# Patient Record
Sex: Female | Born: 1950
Health system: Southern US, Community
[De-identification: ages and names within clinical notes are randomized; demographics above are authoritative.]

## PROBLEM LIST (undated history)

## (undated) DIAGNOSIS — K219 Gastro-esophageal reflux disease without esophagitis: Secondary | ICD-10-CM

## (undated) DIAGNOSIS — I10 Essential (primary) hypertension: Secondary | ICD-10-CM

## (undated) HISTORY — DX: Essential (primary) hypertension: I10

## (undated) HISTORY — DX: Gastro-esophageal reflux disease without esophagitis: K21.9

---

## 2005-07-29 LAB — HM DEXA SCAN

## 2006-07-25 HISTORY — PX: COLONOSCOPY: SHX174

## 2007-07-26 HISTORY — PX: CAROTID ENDARTERECTOMY: SUR193

## 2008-07-26 LAB — HM COLONOSCOPY: HM Colonoscopy: NORMAL

## 2010-09-11 ENCOUNTER — Ambulatory Visit: Payer: Self-pay | Admitting: Internal Medicine

## 2012-11-27 DIAGNOSIS — I6529 Occlusion and stenosis of unspecified carotid artery: Secondary | ICD-10-CM | POA: Insufficient documentation

## 2013-03-26 DIAGNOSIS — R87619 Unspecified abnormal cytological findings in specimens from cervix uteri: Secondary | ICD-10-CM | POA: Insufficient documentation

## 2013-04-24 LAB — HM PAP SMEAR

## 2014-03-03 DIAGNOSIS — R928 Other abnormal and inconclusive findings on diagnostic imaging of breast: Secondary | ICD-10-CM | POA: Insufficient documentation

## 2014-03-12 DIAGNOSIS — Z853 Personal history of malignant neoplasm of breast: Secondary | ICD-10-CM | POA: Insufficient documentation

## 2014-05-25 HISTORY — PX: BILATERAL TOTAL MASTECTOMY WITH AXILLARY LYMPH NODE DISSECTION: SHX6364

## 2014-09-15 ENCOUNTER — Ambulatory Visit: Payer: Self-pay | Admitting: Physician Assistant

## 2014-11-11 LAB — TSH: TSH: 1.47 u[IU]/mL (ref <=5.90)

## 2014-11-11 LAB — LIPID PANEL
CHOLESTEROL: 232 mg/dL — AB (ref 0–200)
HDL: 124 mg/dL — AB (ref 35–70)
LDL CALC: 100 mg/dL
Triglycerides: 38 mg/dL — AB (ref 40–160)

## 2014-11-11 LAB — BASIC METABOLIC PANEL
BUN: 21 mg/dL (ref 4–21)
Creatinine: 0.7 mg/dL (ref <=1.1)

## 2014-11-11 LAB — CBC AND DIFFERENTIAL: HEMOGLOBIN: 13.4 g/dL (ref 12.0–16.0)

## 2014-11-28 DIAGNOSIS — C50119 Malignant neoplasm of central portion of unspecified female breast: Secondary | ICD-10-CM | POA: Insufficient documentation

## 2014-12-25 ENCOUNTER — Other Ambulatory Visit: Payer: Self-pay | Admitting: Internal Medicine

## 2015-05-27 ENCOUNTER — Other Ambulatory Visit: Payer: Self-pay | Admitting: Internal Medicine

## 2015-05-27 ENCOUNTER — Encounter: Payer: Self-pay | Admitting: Internal Medicine

## 2015-05-27 DIAGNOSIS — K2100 Gastro-esophageal reflux disease with esophagitis, without bleeding: Secondary | ICD-10-CM | POA: Insufficient documentation

## 2015-05-27 DIAGNOSIS — M159 Polyosteoarthritis, unspecified: Secondary | ICD-10-CM | POA: Insufficient documentation

## 2015-05-27 DIAGNOSIS — E785 Hyperlipidemia, unspecified: Secondary | ICD-10-CM | POA: Insufficient documentation

## 2015-05-27 DIAGNOSIS — D0510 Intraductal carcinoma in situ of unspecified breast: Secondary | ICD-10-CM | POA: Insufficient documentation

## 2015-05-27 DIAGNOSIS — I10 Essential (primary) hypertension: Secondary | ICD-10-CM | POA: Insufficient documentation

## 2015-05-27 DIAGNOSIS — M810 Age-related osteoporosis without current pathological fracture: Secondary | ICD-10-CM | POA: Insufficient documentation

## 2015-10-05 ENCOUNTER — Other Ambulatory Visit: Payer: Self-pay | Admitting: Internal Medicine

## 2015-10-05 MED ORDER — HYDROCHLOROTHIAZIDE 12.5 MG PO CAPS: 12.5000 mg | ORAL_CAPSULE | Freq: Every day | ORAL | Status: DC

## 2015-11-07 ENCOUNTER — Other Ambulatory Visit: Payer: Self-pay | Admitting: Internal Medicine

## 2015-11-23 ENCOUNTER — Other Ambulatory Visit: Payer: Self-pay

## 2015-11-23 ENCOUNTER — Ambulatory Visit (INDEPENDENT_AMBULATORY_CARE_PROVIDER_SITE_OTHER): Payer: BC Managed Care – PPO | Admitting: Internal Medicine

## 2015-11-23 ENCOUNTER — Encounter: Payer: Self-pay | Admitting: Internal Medicine

## 2015-11-23 VITALS — BP 142/78 | HR 61 | Temp 98.1°F | Resp 16 | Ht 63.0 in | Wt 148.0 lb

## 2015-11-23 DIAGNOSIS — H109 Unspecified conjunctivitis: Secondary | ICD-10-CM

## 2015-11-23 DIAGNOSIS — J0100 Acute maxillary sinusitis, unspecified: Secondary | ICD-10-CM | POA: Diagnosis not present

## 2015-11-23 DIAGNOSIS — E7800 Pure hypercholesterolemia, unspecified: Secondary | ICD-10-CM | POA: Insufficient documentation

## 2015-11-23 DIAGNOSIS — I10 Essential (primary) hypertension: Secondary | ICD-10-CM | POA: Insufficient documentation

## 2015-11-23 MED ORDER — NEOMYCIN-POLYMYXIN-DEXAMETH 3.5-10000-0.1 OP SUSP: 2.0000 [drp] | Freq: Four times a day (QID) | OPHTHALMIC | Status: DC

## 2015-11-23 MED ORDER — AMOXICILLIN-POT CLAVULANATE 875-125 MG PO TABS
1.0000 | ORAL_TABLET | Freq: Two times a day (BID) | ORAL | Status: DC
Start: 2015-11-23 — End: 2015-12-02

## 2015-11-23 NOTE — Progress Notes (Signed)
Date:  11/23/2015   Name:  Sharon Ayers   DOB:  05-Mar-1951   MRN:  [id number]   Chief Complaint: Conjunctivitis and Sore Throat Conjunctivitis  The current episode started today. The problem has been rapidly worsening. The problem is severe. Associated symptoms include decreased vision, ear pain, sore throat, swollen glands, eye discharge, eye pain and eye redness. Pertinent negatives include no fever, no photophobia and no ear discharge. The eye pain is moderate. Both eyes are affected. Sore Throat  This is a new problem. The current episode started yesterday. There has been no fever. Associated symptoms include ear pain and swollen glands. Pertinent negatives include no ear discharge, shortness of breath or trouble swallowing. She has had no exposure to strep or mono. She has tried nothing for the symptoms.     Review of Systems  Constitutional: Negative for fever.  HENT: Positive for ear pain and sore throat. Negative for ear discharge and trouble swallowing.   Eyes: Positive for pain, discharge, redness and visual disturbance. Negative for photophobia.  Respiratory: Negative for chest tightness and shortness of breath.   Cardiovascular: Negative for chest pain, palpitations and leg swelling.    Patient Active Problem List   Diagnosis Date Noted  . Hypercholesterolemia 11/23/2015  . BP (high blood pressure) 11/23/2015  . Carcinoma in situ, breast, ductal 05/27/2015  . Dyslipidemia 05/27/2015  . Essential (primary) hypertension 05/27/2015  . Generalized OA 05/27/2015  . Esophagitis, reflux 05/27/2015  . Calcium blood increased 05/27/2015  . OP (osteoporosis) 05/27/2015  . Primary malignant neoplasm of central portion of female breast ([place]) 11/28/2014  . Malignant neoplasm of breast ([place]) 03/12/2014  . Abnormal finding on mammography 03/03/2014  . Abnormal Pap smear of cervix 03/26/2013  . Encounter for gynecological examination without abnormal finding 02/19/2013     Prior to Admission medications   Medication Sig Start Date End Date Taking? Authorizing Provider  aspirin 325 MG tablet Take 1 tablet by mouth daily.   Yes Historical Provider, MD  atenolol (TENORMIN) 50 MG tablet Take 0.5 tablets by mouth daily. 11/11/14  Yes Historical Provider, MD  Calcium Carb-Cholecalciferol (CALCIUM + D3) 600-200 MG-UNIT TABS Take by mouth.   Yes Historical Provider, MD  Calcium Carbonate-Vitamin D 600-400 MG-UNIT tablet Take by mouth.   Yes Historical Provider, MD  hydrochlorothiazide (MICROZIDE) 12.5 MG capsule Take 1 capsule (12.5 mg total) by mouth daily. 10/05/15  Yes Glean Hess, MD  MULTIPLE VITAMIN PO Take by mouth.   Yes Historical Provider, MD  Omega-3 Fatty Acids (FISH OIL BURP-LESS) 1000 MG CAPS Take 1 capsule by mouth daily.   Yes Historical Provider, MD  valsartan (DIOVAN) 320 MG tablet TAKE 1 TABLET DAILY 11/07/15  Yes Glean Hess, MD  ZEGERID OTC 20-1100 MG CAPS capsule TAKE 1 CAPSULE BY MOUTH EVERY DAY 12/25/14  Yes Glean Hess, MD    Allergies  Allergen Reactions  . Ace Inhibitors Cough  . Diclofenac Sodium     Vomiting    Past Surgical History  Procedure Laterality Date  . Bilateral total mastectomy with axillary lymph node dissection  05/2014    DCIS right breast ER/PR-  . Carotid endarterectomy  2009    Social History  Substance Use Topics  . Smoking status: Never Smoker   . Smokeless tobacco: None  . Alcohol Use: 1.2 oz/week    2 Standard drinks or equivalent per week     Medication list has been reviewed and updated.   Physical  Exam  Constitutional: She appears well-developed. She appears distressed.  HENT:  Right Ear: Tympanic membrane is retracted. Tympanic membrane is not erythematous.  Left Ear: Tympanic membrane is retracted. Tympanic membrane is not erythematous.  Nose: Right sinus exhibits maxillary sinus tenderness. Left sinus exhibits maxillary sinus tenderness.  Mouth/Throat: Uvula is midline.  Posterior oropharyngeal erythema present. No posterior oropharyngeal edema.  Eyes: EOM are normal. Pupils are equal, round, and reactive to light. Right eye exhibits chemosis and discharge. Left eye exhibits chemosis and discharge. Right conjunctiva is injected. Left conjunctiva is injected.  Neck: Normal range of motion. Neck supple.  Cardiovascular: Normal rate, regular rhythm and normal heart sounds.   Pulmonary/Chest: Effort normal and breath sounds normal. She has no wheezes.  Lymphadenopathy:    She has cervical adenopathy.       Right cervical: Superficial cervical adenopathy present.       Left cervical: Superficial cervical adenopathy present.  Nursing note and vitals reviewed.   BP 142/78 mmHg  Pulse 61  Temp(Src) 98.1 F (36.7 C) (Oral)  Resp 16  Ht 5\' 3"  (1.6 m)  Wt 148 lb (67.132 kg)  BMI 26.22 kg/m2  SpO2 99%  Assessment and Plan: 1. Bilateral conjunctivitis If no significant improvement in the next 24 hours, would consult Ophthalmology - neomycin-polymyxin b-dexamethasone (MAXITROL) 3.5-10000-0.1 SUSP; Place 2 drops into both eyes every 6 (six) hours.  Dispense: 5 mL; Refill: 0  2. Acute maxillary sinusitis, recurrence not specified Begin Flonase NS - amoxicillin-clavulanate (AUGMENTIN) 875-125 MG tablet; Take 1 tablet by mouth 2 (two) times daily.  Dispense: 20 tablet; Refill: 0   Halina Maidens, MD [facility]  11/23/2015

## 2015-11-23 NOTE — Patient Instructions (Signed)

## 2015-12-02 ENCOUNTER — Ambulatory Visit (INDEPENDENT_AMBULATORY_CARE_PROVIDER_SITE_OTHER): Payer: BC Managed Care – PPO | Admitting: Internal Medicine

## 2015-12-02 ENCOUNTER — Encounter: Payer: Self-pay | Admitting: Internal Medicine

## 2015-12-02 VITALS — BP 122/80 | HR 68 | Temp 97.9°F | Resp 16 | Ht 63.0 in | Wt 132.0 lb

## 2015-12-02 DIAGNOSIS — C50911 Malignant neoplasm of unspecified site of right female breast: Secondary | ICD-10-CM

## 2015-12-02 DIAGNOSIS — I10 Essential (primary) hypertension: Secondary | ICD-10-CM

## 2015-12-02 DIAGNOSIS — K2100 Gastro-esophageal reflux disease with esophagitis, without bleeding: Secondary | ICD-10-CM

## 2015-12-02 DIAGNOSIS — Z1159 Encounter for screening for other viral diseases: Secondary | ICD-10-CM | POA: Diagnosis not present

## 2015-12-02 DIAGNOSIS — Z0000 Encounter for general adult medical examination without abnormal findings: Secondary | ICD-10-CM

## 2015-12-02 DIAGNOSIS — E785 Hyperlipidemia, unspecified: Secondary | ICD-10-CM | POA: Diagnosis not present

## 2015-12-02 LAB — POCT URINALYSIS DIPSTICK
BILIRUBIN UA: NEGATIVE
Blood, UA: NEGATIVE
GLUCOSE UA: NEGATIVE
Ketones, UA: NEGATIVE
LEUKOCYTES UA: NEGATIVE
NITRITE UA: NEGATIVE
Protein, UA: NEGATIVE
Spec Grav, UA: 1.01
Urobilinogen, UA: 0.2
pH, UA: 7

## 2015-12-02 MED ORDER — HYDROCHLOROTHIAZIDE 12.5 MG PO CAPS: 12.5000 mg | ORAL_CAPSULE | Freq: Every day | ORAL | Status: DC

## 2015-12-02 MED ORDER — ATENOLOL 50 MG PO TABS: 25.0000 mg | ORAL_TABLET | Freq: Every day | ORAL | Status: DC

## 2015-12-02 NOTE — Progress Notes (Signed)
Date:  12/02/2015   Name:  Sharon Ayers   DOB:  05-09-1951   MRN:  [id number]   Chief Complaint: Annual Exam; Hypertension; and Hyperlipidemia Sharon Ayers is a 65 y.o. female who presents today for her Complete Annual Exam. She feels well. She reports exercising regularly. She reports she is sleeping well. She has lost 16 lbs with diet and exercise.  Last year vytorin was stopped due to excellent labs.  Hypertension This is a chronic problem. The current episode started more than 1 year ago. The problem is unchanged. The problem is controlled. Pertinent negatives include no headaches or shortness of breath. Past treatments include angiotensin blockers, diuretics and beta blockers.  Hyperlipidemia This is a chronic problem. The current episode started more than 1 year ago. The problem is controlled. Recent lipid tests were reviewed and are normal. Pertinent negatives include no shortness of breath. Current antihyperlipidemic treatment includes diet change and exercise. The current treatment provides moderate improvement of lipids.  Gastroesophageal Reflux She reports no abdominal pain, no coughing or no wheezing. This is a chronic problem. The current episode started more than 1 year ago. The problem occurs rarely. The problem has been resolved. Pertinent negatives include no fatigue. She has tried a PPI and an antacid for the symptoms. The treatment provided significant relief.  DCIS Breast - s/p bilateral mastectomies and reconstruction.  She sees Oncology regularly.  She saw her GYN last summer for Pap and Pelvic.   Review of Systems  Constitutional: Negative for fever, chills and fatigue.  HENT: Negative for hearing loss.   Eyes: Negative for visual disturbance.  Respiratory: Negative for cough, chest tightness, shortness of breath and wheezing.   Gastrointestinal: Negative for abdominal pain, constipation and blood in stool.  Genitourinary: Negative for dysuria and  hematuria.  Musculoskeletal: Negative for arthralgias.  Skin: Negative for rash.  Allergic/Immunologic: Negative for environmental allergies.  Neurological: Negative for dizziness and headaches.  Psychiatric/Behavioral: Negative for sleep disturbance. The patient is not nervous/anxious.     Patient Active Problem List   Diagnosis Date Noted  . Carcinoma in situ, breast, ductal 05/27/2015  . Dyslipidemia 05/27/2015  . Essential (primary) hypertension 05/27/2015  . Generalized OA 05/27/2015  . Esophagitis, reflux 05/27/2015  . Calcium blood increased 05/27/2015  . OP (osteoporosis) 05/27/2015  . Malignant neoplasm of breast ([place]) 03/12/2014  . Abnormal Pap smear of cervix 03/26/2013  . Encounter for gynecological examination without abnormal finding 02/19/2013  . Carotid artery narrowing 11/27/2012    Prior to Admission medications   Medication Sig Start Date End Date Taking? Authorizing Provider  amoxicillin (AMOXIL) 875 MG tablet Take 875 mg by mouth 2 (two) times daily.   Yes Historical Provider, MD  aspirin 325 MG tablet Take 1 tablet by mouth daily.   Yes Historical Provider, MD  atenolol (TENORMIN) 50 MG tablet Take 0.5 tablets by mouth daily. 11/11/14  Yes Historical Provider, MD  Calcium Carb-Cholecalciferol (CALCIUM + D3) 600-200 MG-UNIT TABS Take by mouth.   Yes Historical Provider, MD  Coenzyme Q10 ([employer] COENZYME Q10) 100 MG capsule Take by mouth.   Yes Historical Provider, MD  ezetimibe-simvastatin (VYTORIN) 10-40 MG tablet Take by mouth.   Yes Historical Provider, MD  hydrochlorothiazide (MICROZIDE) 12.5 MG capsule Take 1 capsule (12.5 mg total) by mouth daily. 10/05/15  Yes Glean Hess, MD  MULTIPLE VITAMIN PO Take by mouth.   Yes Historical Provider, MD  neomycin-polymyxin b-dexamethasone (MAXITROL) 3.5-10000-0.1 SUSP Place 2 drops into  both eyes every 6 (six) hours. 11/23/15  Yes Glean Hess, MD  Omega-3 Fatty Acids (FISH OIL BURP-LESS) 1000 MG CAPS Take 1 capsule  by mouth daily.   Yes Historical Provider, MD  valsartan (DIOVAN) 320 MG tablet TAKE 1 TABLET DAILY 11/07/15  Yes Glean Hess, MD  ZEGERID OTC 20-1100 MG CAPS capsule TAKE 1 CAPSULE BY MOUTH EVERY DAY 12/25/14  Yes Glean Hess, MD    Allergies  Allergen Reactions  . Ace Inhibitors Cough  . Diclofenac Diarrhea and Nausea And Vomiting  . Diclofenac Sodium     Vomiting    Past Surgical History  Procedure Laterality Date  . Bilateral total mastectomy with axillary lymph node dissection  05/2014    DCIS right breast ER/PR-  . Carotid endarterectomy  2009  . Colonoscopy  2008    Social History  Substance Use Topics  . Smoking status: Never Smoker   . Smokeless tobacco: None  . Alcohol Use: 1.2 oz/week    2 Standard drinks or equivalent per week    Medication list has been reviewed and updated.   Physical Exam  Constitutional: She is oriented to person, place, and time. She appears well-developed and well-nourished. No distress.  HENT:  Head: Normocephalic and atraumatic.  Right Ear: Tympanic membrane and ear canal normal.  Left Ear: Tympanic membrane and ear canal normal.  Nose: Right sinus exhibits no maxillary sinus tenderness. Left sinus exhibits no maxillary sinus tenderness.  Mouth/Throat: Uvula is midline and oropharynx is clear and moist.  Eyes: Conjunctivae and EOM are normal. Right eye exhibits no discharge. Left eye exhibits no discharge. No scleral icterus.  Neck: Normal range of motion. Carotid bruit is not present. No erythema present. No thyromegaly present.  Cardiovascular: Normal rate, regular rhythm, normal heart sounds and normal pulses.   Pulmonary/Chest: Effort normal. No respiratory distress. She has no wheezes.  Bilateral breast reconstruction scars present - no skin dimpling or color change  Abdominal: Soft. Bowel sounds are normal. There is no hepatosplenomegaly. There is no tenderness. There is no CVA tenderness.  Musculoskeletal: Normal range  of motion.  Lymphadenopathy:    She has no cervical adenopathy.    She has no axillary adenopathy.  Neurological: She is alert and oriented to person, place, and time. She has normal reflexes. No cranial nerve deficit or sensory deficit.  Skin: Skin is warm, dry and intact. No rash noted.  Psychiatric: She has a normal mood and affect. Her speech is normal and behavior is normal. Thought content normal.  Nursing note and vitals reviewed.   BP 122/80 mmHg  Pulse 68  Temp(Src) 97.9 F (36.6 C)  Resp 16  Ht 5\' 3"  (1.6 m)  Wt 132 lb (59.875 kg)  BMI 23.39 kg/m2  SpO2 99%  Assessment and Plan: 1. Annual physical exam Up to date on Mammogram/breast exam and pap  2. Need for hepatitis C screening test - Hepatitis C antibody  3. Essential (primary) hypertension controlled - atenolol (TENORMIN) 50 MG tablet; Take 0.5 tablets (25 mg total) by mouth daily.  Dispense: 45 tablet; Refill: 3 - hydrochlorothiazide (MICROZIDE) 12.5 MG capsule; Take 1 capsule (12.5 mg total) by mouth daily.  Dispense: 90 capsule; Refill: 3 - Comprehensive metabolic panel - TSH - POCT urinalysis dipstick  4. Esophagitis, reflux On zegerid with good control  - CBC with Differential/Platelet  5. Dyslipidemia Off of medication but following good diet and lost weight S/p CEA on right - gets [facility] screenings  yearly - Lipid panel  6. Malignant neoplasm of right female breast, unspecified site of breast ([place]) Follow up with Oncology   Halina Maidens, MD [facility]  12/02/2015

## 2015-12-02 NOTE — Addendum Note (Signed)
Addended by: Fredderick Severance on: 12/02/2015 02:54 PM   Modules accepted: Orders

## 2015-12-02 NOTE — Patient Instructions (Signed)
DASH Eating Plan  DASH stands for "Dietary Approaches to Stop Hypertension." The DASH eating plan is a healthy eating plan that has been shown to reduce high blood pressure (hypertension). Additional health benefits may include reducing the risk of type 2 diabetes mellitus, heart disease, and stroke. The DASH eating plan may also help with weight loss.  WHAT DO I NEED TO KNOW ABOUT THE DASH EATING PLAN?  For the DASH eating plan, you will follow these general guidelines:  · Choose foods with a percent daily value for sodium of less than 5% (as listed on the food label).  · Use salt-free seasonings or herbs instead of table salt or sea salt.  · Check with your health care provider or pharmacist before using salt substitutes.  · Eat lower-sodium products, often labeled as "lower sodium" or "no salt added."  · Eat fresh foods.  · Eat more vegetables, fruits, and low-fat dairy products.  · Choose whole grains. Look for the word "whole" as the first word in the ingredient list.  · Choose fish and skinless chicken or turkey more often than red meat. Limit fish, poultry, and meat to 6 oz (170 g) each day.  · Limit sweets, desserts, sugars, and sugary drinks.  · Choose heart-healthy fats.  · Limit cheese to 1 oz (28 g) per day.  · Eat more home-cooked food and less restaurant, buffet, and fast food.  · Limit fried foods.  · Cook foods using methods other than frying.  · Limit canned vegetables. If you do use them, rinse them well to decrease the sodium.  · When eating at a restaurant, ask that your food be prepared with less salt, or no salt if possible.  WHAT FOODS CAN I EAT?  Seek help from a dietitian for individual calorie needs.  Grains  Whole grain or whole wheat bread. Brown rice. Whole grain or whole wheat pasta. Quinoa, bulgur, and whole grain cereals. Low-sodium cereals. Corn or whole wheat flour tortillas. Whole grain cornbread. Whole grain crackers. Low-sodium crackers.  Vegetables  Fresh or frozen vegetables  (raw, steamed, roasted, or grilled). Low-sodium or reduced-sodium tomato and vegetable juices. Low-sodium or reduced-sodium tomato sauce and paste. Low-sodium or reduced-sodium canned vegetables.   Fruits  All fresh, canned (in natural juice), or frozen fruits.  Meat and Other Protein Products  Ground beef (85% or leaner), grass-fed beef, or beef trimmed of fat. Skinless chicken or turkey. Ground chicken or turkey. Pork trimmed of fat. All fish and seafood. Eggs. Dried beans, peas, or lentils. Unsalted nuts and seeds. Unsalted canned beans.  Dairy  Low-fat dairy products, such as skim or 1% milk, 2% or reduced-fat cheeses, low-fat ricotta or cottage cheese, or plain low-fat yogurt. Low-sodium or reduced-sodium cheeses.  Fats and Oils  Tub margarines without trans fats. Light or reduced-fat mayonnaise and salad dressings (reduced sodium). Avocado. Safflower, olive, or canola oils. Natural peanut or almond butter.  Other  Unsalted popcorn and pretzels.  The items listed above may not be a complete list of recommended foods or beverages. Contact your dietitian for more options.  WHAT FOODS ARE NOT RECOMMENDED?  Grains  White bread. White pasta. White rice. Refined cornbread. Bagels and croissants. Crackers that contain trans fat.  Vegetables  Creamed or fried vegetables. Vegetables in a cheese sauce. Regular canned vegetables. Regular canned tomato sauce and paste. Regular tomato and vegetable juices.  Fruits  Dried fruits. Canned fruit in light or heavy syrup. Fruit juice.  Meat and Other Protein   Products  Fatty cuts of meat. Ribs, chicken wings, bacon, sausage, bologna, salami, chitterlings, fatback, hot dogs, bratwurst, and packaged luncheon meats. Salted nuts and seeds. Canned beans with salt.  Dairy  Whole or 2% milk, cream, half-and-half, and cream cheese. Whole-fat or sweetened yogurt. Full-fat cheeses or blue cheese. Nondairy creamers and whipped toppings. Processed cheese, cheese spreads, or cheese  curds.  Condiments  Onion and garlic salt, seasoned salt, table salt, and sea salt. Canned and packaged gravies. Worcestershire sauce. Tartar sauce. Barbecue sauce. Teriyaki sauce. Soy sauce, including reduced sodium. Steak sauce. Fish sauce. Oyster sauce. Cocktail sauce. Horseradish. Ketchup and mustard. Meat flavorings and tenderizers. Bouillon cubes. Hot sauce. Tabasco sauce. Marinades. Taco seasonings. Relishes.  Fats and Oils  Butter, stick margarine, lard, shortening, ghee, and bacon fat. Coconut, palm kernel, or palm oils. Regular salad dressings.  Other  Pickles and olives. Salted popcorn and pretzels.  The items listed above may not be a complete list of foods and beverages to avoid. Contact your dietitian for more information.  WHERE CAN I FIND MORE INFORMATION?  National Heart, Lung, and [employer] Institute: www.nhlbi.nih.gov/health/health-topics/topics/dash/     This information is not intended to replace advice given to you by your health care provider. Make sure you discuss any questions you have with your health care provider.     Document Released: 06/30/2011 Document Revised: 08/01/2014 Document Reviewed: 05/15/2013  Elsevier Interactive Patient Education ©2016 [employer]

## 2015-12-02 NOTE — Addendum Note (Signed)
Addended by: Fredderick Severance on: 12/02/2015 02:53 PM   Modules accepted: Orders

## 2015-12-03 LAB — COMPREHENSIVE METABOLIC PANEL
A/G RATIO: 2.3 — AB (ref 1.2–2.2)
ALT: 14 IU/L (ref 0–32)
AST: 18 IU/L (ref 0–40)
Albumin: 4.6 g/dL (ref 3.6–4.8)
Alkaline Phosphatase: 71 IU/L (ref 39–117)
BUN/Creatinine Ratio: 23 (ref 12–28)
BUN: 16 mg/dL (ref 8–27)
Bilirubin Total: 0.3 mg/dL (ref 0.0–1.2)
CALCIUM: 9.6 mg/dL (ref 8.7–10.3)
CO2: 25 mmol/L (ref 18–29)
CREATININE: 0.7 mg/dL (ref 0.57–1.00)
Chloride: 95 mmol/L — ABNORMAL LOW (ref 96–106)
GFR, EST AFRICAN AMERICAN: 107 mL/min/{1.73_m2} (ref >59)
GFR, EST NON AFRICAN AMERICAN: 93 mL/min/{1.73_m2} (ref >59)
GLUCOSE: 81 mg/dL (ref 65–99)
Globulin, Total: 2 g/dL (ref 1.5–4.5)
POTASSIUM: 4.5 mmol/L (ref 3.5–5.2)
Sodium: 139 mmol/L (ref 134–144)
TOTAL PROTEIN: 6.6 g/dL (ref 6.0–8.5)

## 2015-12-03 LAB — CBC WITH DIFFERENTIAL/PLATELET
BASOS ABS: 0 10*3/uL (ref 0.0–0.2)
Basos: 0 %
EOS (ABSOLUTE): 0.1 10*3/uL (ref 0.0–0.4)
Eos: 1 %
Hematocrit: 40.3 % (ref 34.0–46.6)
Hemoglobin: 13.5 g/dL (ref 11.1–15.9)
IMMATURE GRANS (ABS): 0 10*3/uL (ref 0.0–0.1)
IMMATURE GRANULOCYTES: 0 %
Lymphocytes Absolute: 2.1 10*3/uL (ref 0.7–3.1)
Lymphs: 37 %
MCH: 31 pg (ref 26.6–33.0)
MCHC: 33.5 g/dL (ref 31.5–35.7)
MCV: 92 fL (ref 79–97)
MONOS ABS: 0.6 10*3/uL (ref 0.1–0.9)
Monocytes: 10 %
NEUTROS PCT: 52 %
Neutrophils Absolute: 2.8 10*3/uL (ref 1.4–7.0)
PLATELETS: 265 10*3/uL (ref 150–379)
RBC: 4.36 x10E6/uL (ref 3.77–5.28)
RDW: 13.2 % (ref 12.3–15.4)
WBC: 5.5 10*3/uL (ref 3.4–10.8)

## 2015-12-03 LAB — LIPID PANEL
CHOL/HDL RATIO: 1.9 ratio (ref 0.0–4.4)
Cholesterol, Total: 263 mg/dL — ABNORMAL HIGH (ref 100–199)
HDL: 139 mg/dL (ref >39)
LDL Calculated: 117 mg/dL — ABNORMAL HIGH (ref 0–99)
TRIGLYCERIDES: 37 mg/dL (ref 0–149)
VLDL CHOLESTEROL CAL: 7 mg/dL (ref 5–40)

## 2015-12-03 LAB — HEPATITIS C ANTIBODY: Hep C Virus Ab: <0.1 s/co ratio (ref 0.0–0.9)

## 2015-12-03 LAB — TSH: TSH: 1.59 u[IU]/mL (ref 0.450–4.500)

## 2015-12-04 ENCOUNTER — Other Ambulatory Visit: Payer: Self-pay | Admitting: Internal Medicine

## 2015-12-04 DIAGNOSIS — H109 Unspecified conjunctivitis: Secondary | ICD-10-CM

## 2015-12-04 MED ORDER — NEOMYCIN-POLYMYXIN-DEXAMETH 3.5-10000-0.1 OP SUSP: 2.0000 [drp] | Freq: Four times a day (QID) | OPHTHALMIC | Status: DC

## 2016-01-12 ENCOUNTER — Other Ambulatory Visit: Payer: Self-pay | Admitting: Internal Medicine

## 2016-04-28 ENCOUNTER — Telehealth: Payer: Self-pay

## 2016-04-28 NOTE — Telephone Encounter (Signed)
Atenelol causing diziness and faint like spells. Can meds be changed or does she need OV?

## 2016-04-28 NOTE — Telephone Encounter (Signed)
Tell her to stop the Atenolol and make a follow up with me about 7-10 days later.

## 2016-05-05 ENCOUNTER — Ambulatory Visit (INDEPENDENT_AMBULATORY_CARE_PROVIDER_SITE_OTHER): Payer: BC Managed Care – PPO | Admitting: Internal Medicine

## 2016-05-05 ENCOUNTER — Encounter: Payer: Self-pay | Admitting: Internal Medicine

## 2016-05-05 VITALS — BP 138/64 | HR 62 | Ht 63.0 in | Wt 135.0 lb

## 2016-05-05 DIAGNOSIS — I10 Essential (primary) hypertension: Secondary | ICD-10-CM

## 2016-05-05 NOTE — Progress Notes (Signed)
Date:  05/05/2016   Name:  Sharon Ayers   DOB:  [birth year]   MRN:  [id number]   Chief Complaint: Follow-up (stopped Atenolol- was taking 1/2 tablet and by 9:00 in the morning "I would feel faint") Several weeks ago had several episodes of feeling faint.  No pain or SOB, no palpitations associated.  Always occurred with little activity.  She was able to breath rapidly and the sx resolved.  She was instructed to stop Atenolol and has done so for the past week.  She has not had any further sx.  She does not check her BP - cuff is broken.   Review of Systems  Constitutional: Negative for chills, diaphoresis, fatigue and fever.  Respiratory: Negative for cough, chest tightness and shortness of breath.   Cardiovascular: Negative for chest pain, palpitations and leg swelling.  Neurological: Positive for light-headedness. Negative for dizziness, syncope and headaches.  Hematological: Negative for adenopathy.    Patient Active Problem List   Diagnosis Date Noted  . Carcinoma in situ, breast, ductal 05/27/2015  . Dyslipidemia 05/27/2015  . Essential (primary) hypertension 05/27/2015  . Generalized OA 05/27/2015  . Esophagitis, reflux 05/27/2015  . Calcium blood increased 05/27/2015  . OP (osteoporosis) 05/27/2015  . Malignant neoplasm of breast ([place]) 03/12/2014  . Abnormal Pap smear of cervix 03/26/2013  . Encounter for gynecological examination without abnormal finding 02/19/2013  . Carotid artery narrowing 11/27/2012    Prior to Admission medications   Medication Sig Start Date End Date Taking? Authorizing Provider  aspirin 325 MG tablet Take 1 tablet by mouth daily.   Yes Historical Provider, MD  hydrochlorothiazide (MICROZIDE) 12.5 MG capsule Take 1 capsule (12.5 mg total) by mouth daily. 12/02/15  Yes Glean Hess, MD  MULTIPLE VITAMIN PO Take by mouth.   Yes Historical Provider, MD  Omega-3 Fatty Acids (FISH OIL BURP-LESS) 1000 MG CAPS Take 1 capsule by mouth daily.    Yes Historical Provider, MD  valsartan (DIOVAN) 320 MG tablet TAKE 1 TABLET DAILY 11/07/15  Yes Glean Hess, MD  ZEGERID OTC 20-1100 MG CAPS capsule TAKE 1 CAPSULE BY MOUTH EVERY DAY 12/25/14  Yes Glean Hess, MD  atenolol (TENORMIN) 50 MG tablet Take 0.5 tablets (25 mg total) by mouth daily. Patient not taking: Reported on 05/05/2016 12/02/15   Glean Hess, MD    Allergies  Allergen Reactions  . Ace Inhibitors Cough  . Diclofenac Diarrhea and Nausea And Vomiting    Past Surgical History:  Procedure Laterality Date  . BILATERAL TOTAL MASTECTOMY WITH AXILLARY LYMPH NODE DISSECTION  05/2014   DCIS right breast ER/PR-  . CAROTID ENDARTERECTOMY  2009  . COLONOSCOPY  2008    Social History  Substance Use Topics  . Smoking status: Never Smoker  . Smokeless tobacco: Not on file  . Alcohol use 1.2 oz/week    2 Standard drinks or equivalent per week     Medication list has been reviewed and updated.   Physical Exam  Constitutional: She is oriented to person, place, and time. She appears well-developed. No distress.  HENT:  Head: Normocephalic and atraumatic.  Neck: Normal range of motion. Neck supple. No thyromegaly present.  Cardiovascular: Normal rate, regular rhythm and normal heart sounds.   Pulmonary/Chest: Effort normal and breath sounds normal. No respiratory distress.  Musculoskeletal: Normal range of motion. She exhibits no edema or tenderness.  Neurological: She is alert and oriented to person, place, and time.  Skin: Skin is  warm and dry. No rash noted.  Psychiatric: She has a normal mood and affect. Her behavior is normal. Thought content normal.  Nursing note and vitals reviewed.   BP 138/64   Pulse 62   Ht 5\' 3"  (1.6 m)   Wt 135 lb (61.2 kg)   BMI 23.91 kg/m   Assessment and Plan: 1. Essential (primary) hypertension Controlled Hypotensive/bradycardic on atenolol so discontinued Follow up if symptoms recur   Halina Maidens, MD [facility]  05/05/2016

## 2016-11-01 ENCOUNTER — Other Ambulatory Visit: Payer: Self-pay | Admitting: Internal Medicine

## 2016-12-06 ENCOUNTER — Encounter: Payer: Self-pay | Admitting: Family Medicine

## 2016-12-06 ENCOUNTER — Ambulatory Visit (INDEPENDENT_AMBULATORY_CARE_PROVIDER_SITE_OTHER): Payer: BC Managed Care – PPO | Admitting: Family Medicine

## 2016-12-06 VITALS — BP 118/64 | HR 86 | Temp 98.1°F | Ht 63.0 in | Wt 133.0 lb

## 2016-12-06 DIAGNOSIS — J4521 Mild intermittent asthma with (acute) exacerbation: Secondary | ICD-10-CM

## 2016-12-06 DIAGNOSIS — J40 Bronchitis, not specified as acute or chronic: Secondary | ICD-10-CM | POA: Diagnosis not present

## 2016-12-06 DIAGNOSIS — M940 Chondrocostal junction syndrome [Tietze]: Secondary | ICD-10-CM

## 2016-12-06 MED ORDER — ALBUTEROL SULFATE HFA 108 (90 BASE) MCG/ACT IN AERS: 2.0000 | INHALATION_SPRAY | Freq: Four times a day (QID) | RESPIRATORY_TRACT | 0 refills | Status: DC | PRN

## 2016-12-06 MED ORDER — AMOXICILLIN-POT CLAVULANATE 875-125 MG PO TABS: 1.0000 | ORAL_TABLET | Freq: Two times a day (BID) | ORAL | 0 refills | Status: DC

## 2016-12-06 MED ORDER — IPRATROPIUM-ALBUTEROL 0.5-2.5 (3) MG/3ML IN SOLN: 3.0000 mL | Freq: Once | RESPIRATORY_TRACT | Status: DC

## 2016-12-06 MED ORDER — GUAIFENESIN-CODEINE 100-10 MG/5ML PO SYRP
5.0000 mL | ORAL_SOLUTION | Freq: Three times a day (TID) | ORAL | 0 refills | Status: DC | PRN
Start: 2016-12-06 — End: 2017-02-07

## 2016-12-06 NOTE — Patient Instructions (Signed)
How to Use a Metered Dose Inhaler A metered dose inhaler is a handheld device for taking medicine that must be breathed into the lungs (inhaled). The device can be used to deliver a variety of inhaled medicines, including:  Quick relief or rescue medicines, such as bronchodilators.  Controller medicines, such as corticosteroids.  The medicine is delivered by pushing down on a metal canister to release a preset amount of spray and medicine. Each device contains the amount of medicine that is needed for a preset number of uses (inhalations). Your health care provider may recommend that you use a spacer with your inhaler to help you take the medicine more effectively. A spacer is a plastic tube with a mouthpiece on one end and an opening that connects to the inhaler on the other end. A spacer holds the medicine in a tube for a short time, which allows you to inhale more medicine. What are the risks? If you do not use your inhaler correctly, medicine might not reach your lungs to help you breathe. Inhaler medicine can cause side effects, such as:  Mouth or throat infection.  Cough.  Hoarseness.  Headache.  Nausea and vomiting.  Lung infection (pneumonia) in people who have a lung condition called COPD.  How to use a metered dose inhaler without a spacer 1. Remove the cap from the inhaler. 2. If you are using the inhaler for the first time, shake it for 5 seconds, turn it away from your face, then release 4 puffs into the air. This is called priming. 3. Shake the inhaler for 5 seconds. 4. Position the inhaler so the top of the canister faces up. 5. Put your index finger on the top of the medicine canister. Support the bottom of the inhaler with your thumb. 6. Breathe out normally and as completely as possible, away from the inhaler. 7. Either place the inhaler between your teeth and close your lips tightly around the mouthpiece, or hold the inhaler 1-2 inches (2.5-5 cm) away from your open  mouth. Keep your tongue down out of the way. If you are unsure which technique to use, ask your health care provider. 8. Press the canister down with your index finger to release the medicine, then inhale deeply and slowly through your mouth (not your nose) until your lungs are completely filled. Inhaling should take 4-6 seconds. 9. Hold the medicine in your lungs for 5-10 seconds (10 seconds is best). This helps the medicine get into the small airways of your lungs. 10. With your lips in a tight circle (pursed), breathe out slowly. 11. Repeat steps 3-10 until you have taken the number of puffs that your health care provider directed. Wait about 1 minute between puffs or as directed. 12. Put the cap on the inhaler. 13. If you are using a steroid inhaler, rinse your mouth with water, gargle, and spit out the water. Do not swallow the water. How to use a metered dose inhaler with a spacer 1. Remove the cap from the inhaler. 2. If you are using the inhaler for the first time, shake it for 5 seconds, turn it away from your face, then release 4 puffs into the air. This is called priming. 3. Shake the inhaler for 5 seconds. 4. Place the open end of the spacer onto the inhaler mouthpiece. 5. Position the inhaler so the top of the canister faces up and the spacer mouthpiece faces you. 6. Put your index finger on the top of the medicine canister.   Support the bottom of the inhaler and the spacer with your thumb. 7. Breathe out normally and as completely as possible, away from the spacer. 8. Place the spacer between your teeth and close your lips tightly around it. Keep your tongue down out of the way. 9. Press the canister down with your index finger to release the medicine, then inhale deeply and slowly through your mouth (not your nose) until your lungs are completely filled. Inhaling should take 4-6 seconds. 10. Hold the medicine in your lungs for 5-10 seconds (10 seconds is best). This helps the medicine  get into the small airways of your lungs. 11. With your lips in a tight circle (pursed), breathe out slowly. 12. Repeat steps 3-11 until you have taken the number of puffs that your health care provider directed. Wait about 1 minute between puffs or as directed. 13. Remove the spacer from the inhaler and put the cap on the inhaler. 14. If you are using a steroid inhaler, rinse your mouth with water, gargle, and spit out the water. Do not swallow the water. Follow these instructions at home:  Take your inhaled medicine only as told by your health care provider. Do not use the inhaler more than directed by your health care provider.  Keep all follow-up visits as told by your health care provider. This is important.  If your inhaler has a counter, you can check it to determine how full your inhaler is. If your inhaler does not have a counter, ask your health care provider when you will need to refill your inhaler and write the refill date on a calendar or on your inhaler canister. Note that you cannot know when an inhaler is empty by shaking it.  Follow directions on the package insert for care and cleaning of your inhaler and spacer. Contact a health care provider if:  Symptoms are only partially relieved with your inhaler.  You are having trouble using your inhaler.  You have an increase in phlegm.  You have headaches. Get help right away if:  You feel little or no relief after using your inhaler.  You have dizziness.  You have a fast heart rate.  You have chills or a fever.  You have night sweats.  There is blood in your phlegm. Summary  A metered dose inhaler is a handheld device for taking medicine that must be breathed into the lungs (inhaled).  The medicine is delivered by pushing down on a metal canister to release a preset amount of spray and medicine.  Each device contains the amount of medicine that is needed for a preset number of uses (inhalations). This  information is not intended to replace advice given to you by your health care provider. Make sure you discuss any questions you have with your health care provider. Document Released: 07/11/2005 Document Revised: 05/31/2016 Document Reviewed: 05/31/2016 Elsevier Interactive Patient Education  2017 [employer]  

## 2016-12-06 NOTE — Progress Notes (Signed)
Name: Sharon Ayers   MRN: [id number]    DOB: [birth year]   Date:12/06/2016       Progress Note  Subjective  Chief Complaint  Chief Complaint  Patient presents with  . Cough    X 1 week. Started as sore throat. At night when tries to sleep, she can't breath well. Also, having ear aches. Coughing with yellow production. Feels like its all stuck in chest and cannot get it up.     Cough  This is a new problem. The current episode started in the past 7 days. The problem has been gradually worsening. The problem occurs every few minutes. The cough is productive of purulent sputum. Associated symptoms include chest pain, ear pain and wheezing. Pertinent negatives include no chills, ear congestion, fever, headaches, heartburn, hemoptysis, myalgias, nasal congestion, postnasal drip, rash, rhinorrhea, sore throat, shortness of breath, sweats or weight loss. Associated symptoms comments: Feel like fire". The symptoms are aggravated by pollens. Her past medical history is significant for bronchitis. There is no history of asthma, bronchiectasis, COPD, emphysema, environmental allergies or pneumonia.    No problem-specific Assessment & Plan notes found for this encounter.   Past Medical History:  Diagnosis Date  . GERD (gastroesophageal reflux disease)   . Hypertension     Past Surgical History:  Procedure Laterality Date  . BILATERAL TOTAL MASTECTOMY WITH AXILLARY LYMPH NODE DISSECTION  05/2014   DCIS right breast ER/PR-  . CAROTID ENDARTERECTOMY  2009  . COLONOSCOPY  2008    Family History  Problem Relation Age of Onset  . Hypertension Mother   . Hypertension Father     Social History   Social History  . Marital status: Single    Spouse name: N/A  . Number of children: N/A  . Years of education: N/A   Occupational History  . Not on file.   Social History Main Topics  . Smoking status: Never Smoker  . Smokeless tobacco: Never Used  . Alcohol use 1.2 oz/week    2  Standard drinks or equivalent per week  . Drug use: Unknown  . Sexual activity: Not on file   Other Topics Concern  . Not on file   Social History Narrative  . No narrative on file    Allergies  Allergen Reactions  . Ace Inhibitors Cough  . Diclofenac Diarrhea and Nausea And Vomiting    Outpatient Medications Prior to Visit  Medication Sig Dispense Refill  . aspirin 325 MG tablet Take 1 tablet by mouth daily.    . hydrochlorothiazide (MICROZIDE) 12.5 MG capsule Take 1 capsule (12.5 mg total) by mouth daily. 90 capsule 3  . MULTIPLE VITAMIN PO Take by mouth.    . Omega-3 Fatty Acids (FISH OIL BURP-LESS) 1000 MG CAPS Take 1 capsule by mouth daily.    . valsartan (DIOVAN) 320 MG tablet TAKE 1 TABLET DAILY 90 tablet 3  . ZEGERID OTC 20-1100 MG CAPS capsule TAKE 1 CAPSULE BY MOUTH EVERY DAY 90 capsule 3   No facility-administered medications prior to visit.     Review of Systems  Constitutional: Negative for chills, fever, malaise/fatigue and weight loss.  HENT: Positive for ear pain. Negative for ear discharge, postnasal drip, rhinorrhea and sore throat.   Eyes: Negative for blurred vision.  Respiratory: Positive for cough and wheezing. Negative for hemoptysis, sputum production and shortness of breath.   Cardiovascular: Positive for chest pain. Negative for palpitations and leg swelling.  Gastrointestinal: Negative for abdominal pain, blood in  stool, constipation, diarrhea, heartburn, melena and nausea.  Genitourinary: Negative for dysuria, frequency, hematuria and urgency.  Musculoskeletal: Negative for back pain, joint pain, myalgias and neck pain.  Skin: Negative for rash.  Neurological: Negative for dizziness, tingling, sensory change, focal weakness and headaches.  Endo/Heme/Allergies: Negative for environmental allergies and polydipsia. Does not bruise/bleed easily.  Psychiatric/Behavioral: Negative for depression and suicidal ideas. The patient is not nervous/anxious and  does not have insomnia.      Objective  Vitals:   12/06/16 0931 12/06/16 1007  BP: 118/64   Pulse: 86   Temp: 98.1 F (36.7 C)   SpO2: 94% 99%  Weight: 133 lb (60.3 kg)   Height: 5\' 3"  (1.6 m)     Physical Exam  Constitutional: She is well-developed, well-nourished, and in no distress. No distress.  HENT:  Head: Normocephalic and atraumatic.  Right Ear: External ear normal.  Left Ear: External ear normal.  Nose: Nose normal.  Mouth/Throat: Oropharynx is clear and moist.  Eyes: Conjunctivae and EOM are normal. Pupils are equal, round, and reactive to light. Right eye exhibits no discharge. Left eye exhibits no discharge.  Neck: Normal range of motion. Neck supple. No JVD present. No thyromegaly present.  Cardiovascular: Normal rate, regular rhythm, normal heart sounds and intact distal pulses.  Exam reveals no gallop and no friction rub.   No murmur heard. Pulmonary/Chest: Effort normal and breath sounds normal. She has no wheezes. She has no rales. She exhibits tenderness.    Abdominal: Soft. Bowel sounds are normal. She exhibits no mass. There is no tenderness. There is no guarding.  Musculoskeletal: Normal range of motion. She exhibits no edema.  Lymphadenopathy:    She has no cervical adenopathy.  Neurological: She is alert. She has normal reflexes.  Skin: Skin is warm and dry. No rash noted. She is not diaphoretic.  Psychiatric: Mood and affect normal.  Nursing note and vitals reviewed.     Assessment & Plan  Problem List Items Addressed This Visit    None    Visit Diagnoses    Bronchitis    -  Primary   Relevant Medications   ipratropium-albuterol (DUONEB) 0.5-2.5 (3) MG/3ML nebulizer solution 3 mL   Mild intermittent reactive airway disease with acute exacerbation       Relevant Medications   ipratropium-albuterol (DUONEB) 0.5-2.5 (3) MG/3ML nebulizer solution 3 mL   albuterol (PROVENTIL HFA;VENTOLIN HFA) 108 (90 Base) MCG/ACT inhaler   Costochondritis           Meds ordered this encounter  Medications  . ipratropium-albuterol (DUONEB) 0.5-2.5 (3) MG/3ML nebulizer solution 3 mL  . DISCONTD: amoxicillin-clavulanate (AUGMENTIN) 875-125 MG tablet    Sig: Take 1 tablet by mouth 2 (two) times daily.    Dispense:  20 tablet    Refill:  0  . guaiFENesin-codeine (ROBITUSSIN AC) 100-10 MG/5ML syrup    Sig: Take 5 mLs by mouth 3 (three) times daily as needed for cough.    Dispense:  150 mL    Refill:  0  . DISCONTD: albuterol (PROVENTIL HFA;VENTOLIN HFA) 108 (90 Base) MCG/ACT inhaler    Sig: Inhale 2 puffs into the lungs every 6 (six) hours as needed for wheezing or shortness of breath.    Dispense:  1 Inhaler    Refill:  0  . amoxicillin-clavulanate (AUGMENTIN) 875-125 MG tablet    Sig: Take 1 tablet by mouth 2 (two) times daily.    Dispense:  20 tablet    Refill:  0  .  albuterol (PROVENTIL HFA;VENTOLIN HFA) 108 (90 Base) MCG/ACT inhaler    Sig: Inhale 2 puffs into the lungs every 6 (six) hours as needed for wheezing or shortness of breath.    Dispense:  1 Inhaler    Refill:  0      Dr. Mac[facility]  12/06/16

## 2016-12-09 ENCOUNTER — Other Ambulatory Visit: Payer: Self-pay

## 2016-12-09 MED ORDER — FLUCONAZOLE 150 MG PO TABS: 150.0000 mg | ORAL_TABLET | Freq: Once | ORAL | 0 refills | Status: AC

## 2016-12-09 MED ORDER — AMOXICILLIN 500 MG PO CAPS: 500.0000 mg | ORAL_CAPSULE | Freq: Three times a day (TID) | ORAL | 0 refills | Status: DC

## 2017-01-27 ENCOUNTER — Other Ambulatory Visit: Payer: Self-pay | Admitting: Internal Medicine

## 2017-02-07 ENCOUNTER — Other Ambulatory Visit: Payer: Self-pay | Admitting: Internal Medicine

## 2017-02-07 ENCOUNTER — Encounter: Payer: Self-pay | Admitting: Internal Medicine

## 2017-02-07 ENCOUNTER — Ambulatory Visit (INDEPENDENT_AMBULATORY_CARE_PROVIDER_SITE_OTHER): Payer: BC Managed Care – PPO | Admitting: Internal Medicine

## 2017-02-07 VITALS — BP 134/72 | HR 73 | Ht 66.0 in | Wt 134.0 lb

## 2017-02-07 DIAGNOSIS — I10 Essential (primary) hypertension: Secondary | ICD-10-CM | POA: Diagnosis not present

## 2017-02-07 DIAGNOSIS — K2100 Gastro-esophageal reflux disease with esophagitis, without bleeding: Secondary | ICD-10-CM

## 2017-02-07 DIAGNOSIS — E785 Hyperlipidemia, unspecified: Secondary | ICD-10-CM

## 2017-02-07 DIAGNOSIS — Z0000 Encounter for general adult medical examination without abnormal findings: Secondary | ICD-10-CM | POA: Diagnosis not present

## 2017-02-07 DIAGNOSIS — Z853 Personal history of malignant neoplasm of breast: Secondary | ICD-10-CM | POA: Diagnosis not present

## 2017-02-07 LAB — POCT URINALYSIS DIPSTICK
BILIRUBIN UA: NEGATIVE
GLUCOSE UA: NEGATIVE
Ketones, UA: NEGATIVE
LEUKOCYTES UA: NEGATIVE
NITRITE UA: NEGATIVE
Protein, UA: NEGATIVE
RBC UA: NEGATIVE
Spec Grav, UA: <=1.005 — AB (ref 1.010–1.025)
UROBILINOGEN UA: 0.2 U/dL
pH, UA: 6.5 (ref 5.0–8.0)

## 2017-02-07 NOTE — Patient Instructions (Signed)
Check with your pharmacy about Shingrix vaccine.  Health Maintenance  Topic Date Due  . INFLUENZA VACCINE  02/22/2017  . PAP SMEAR  03/19/2018  . COLONOSCOPY  08/12/2018  . TETANUS/TDAP  11/02/2022  . Hepatitis C Screening  Completed  . HIV Screening  Addressed

## 2017-02-07 NOTE — Progress Notes (Signed)
Date:  02/07/2017   Name:  Sharon Ayers   DOB:  May 17, 1951   MRN:  [id number]   Chief Complaint: Annual Exam (No breast Exam- and no papsmear. ) Sharon Ayers is a 66 y.o. female who presents today for her Complete Annual Exam. She feels well. She reports exercising daily. She reports she is sleeping well.   She continues to see Oncology to follow up breast cancer.  They perform breast exams and she no longer needs mammograms. She also sees GYN annually for Pap and pelvic.  All exams have been normal.  Hypertension  This is a chronic problem. The problem is unchanged. The problem is controlled. Pertinent negatives include no chest pain, headaches, palpitations, peripheral edema or shortness of breath. There are no associated agents to hypertension. Past treatments include angiotensin blockers and diuretics. The current treatment provides significant improvement. Hypertensive end-organ damage includes PVD (right carotid endarterectomy).  Gastroesophageal Reflux  She complains of heartburn. She reports no abdominal pain, no chest pain, no coughing or no wheezing. The problem occurs rarely. Pertinent negatives include no fatigue. She has tried a histamine-2 antagonist for the symptoms. The treatment provided significant relief.  Hyperlipidemia  This is a chronic problem. The problem is uncontrolled. Pertinent negatives include no chest pain or shortness of breath. She is currently on no antihyperlipidemic treatment. Compliance problems include medication side effects (severe back pain so has not wanted to try another).      Review of Systems  Constitutional: Negative for chills, fatigue and fever.  HENT: Negative for congestion, hearing loss, tinnitus, trouble swallowing and voice change.   Eyes: Negative for visual disturbance.  Respiratory: Negative for cough, chest tightness, shortness of breath and wheezing.   Cardiovascular: Negative for chest pain, palpitations and leg  swelling.  Gastrointestinal: Positive for heartburn. Negative for abdominal pain, constipation, diarrhea and vomiting.  Endocrine: Negative for polydipsia and polyuria.  Genitourinary: Negative for dysuria, frequency, genital sores, vaginal bleeding and vaginal discharge.  Musculoskeletal: Negative for arthralgias, gait problem and joint swelling.  Skin: Negative for color change and rash.  Neurological: Negative for dizziness, tremors, light-headedness and headaches.  Hematological: Negative for adenopathy. Does not bruise/bleed easily.  Psychiatric/Behavioral: Negative for dysphoric mood and sleep disturbance. The patient is not nervous/anxious.     Patient Active Problem List   Diagnosis Date Noted  . Carcinoma in situ, breast, ductal 05/27/2015  . Dyslipidemia 05/27/2015  . Essential (primary) hypertension 05/27/2015  . Generalized OA 05/27/2015  . Esophagitis, reflux 05/27/2015  . Calcium blood increased 05/27/2015  . OP (osteoporosis) 05/27/2015  . Malignant neoplasm of breast ([place]) 03/12/2014  . Abnormal Pap smear of cervix 03/26/2013  . Carotid artery narrowing 11/27/2012    Prior to Admission medications   Medication Sig Start Date End Date Taking? Authorizing Provider  aspirin 325 MG tablet Take 1 tablet by mouth daily.   Yes [provider]  hydrochlorothiazide (MICROZIDE) 12.5 MG capsule TAKE 1 CAPSULE DAILY 01/27/17  Yes Glean Hess, MD  MULTIPLE VITAMIN PO Take by mouth.   Yes [provider]  Omega-3 Fatty Acids (FISH OIL BURP-LESS) 1000 MG CAPS Take 1 capsule by mouth daily.   Yes [provider]  valsartan (DIOVAN) 320 MG tablet TAKE 1 TABLET DAILY 11/01/16  Yes Glean Hess, MD  ZEGERID OTC 20-1100 MG CAPS capsule TAKE 1 CAPSULE BY MOUTH EVERY DAY 12/25/14  Yes Glean Hess, MD    Allergies  Allergen Reactions  .  Ace Inhibitors Cough  . Diclofenac Diarrhea and Nausea And Vomiting    Past Surgical History:  Procedure  Laterality Date  . BILATERAL TOTAL MASTECTOMY WITH AXILLARY LYMPH NODE DISSECTION  05/2014   DCIS right breast ER/PR-  . CAROTID ENDARTERECTOMY  2009  . COLONOSCOPY  2008    Social History  Substance Use Topics  . Smoking status: Never Smoker  . Smokeless tobacco: Never Used  . Alcohol use 1.2 oz/week    2 Standard drinks or equivalent per week     Medication list has been reviewed and updated.   Physical Exam  Constitutional: She is oriented to person, place, and time. She appears well-developed and well-nourished. No distress.  HENT:  Head: Normocephalic and atraumatic.  Right Ear: Tympanic membrane and ear canal normal.  Left Ear: Tympanic membrane and ear canal normal.  Nose: Right sinus exhibits no maxillary sinus tenderness. Left sinus exhibits no maxillary sinus tenderness.  Mouth/Throat: Uvula is midline and oropharynx is clear and moist.  Eyes: Conjunctivae and EOM are normal. Right eye exhibits no discharge. Left eye exhibits no discharge. No scleral icterus.  Neck: Normal range of motion. Carotid bruit is not present. No erythema present. No thyromegaly present.  Cardiovascular: Normal rate, regular rhythm, normal heart sounds and normal pulses.   Pulmonary/Chest: Effort normal. No respiratory distress. She has no wheezes.  Bilateral mastectomies with reconstruction and tatooing present.  Abdominal: Soft. Bowel sounds are normal. There is no hepatosplenomegaly. There is no tenderness. There is no CVA tenderness.  Musculoskeletal: Normal range of motion.  Lymphadenopathy:    She has no cervical adenopathy.    She has no axillary adenopathy.  Neurological: She is alert and oriented to person, place, and time. She has normal reflexes. No cranial nerve deficit or sensory deficit.  Skin: Skin is warm, dry and intact. No rash noted.  Psychiatric: She has a normal mood and affect. Her speech is normal and behavior is normal. Thought content normal.  Nursing note and vitals  reviewed.   BP 134/72   Pulse 73   Ht 5\' 6"  (1.676 m)   Wt 134 lb (60.8 kg)   SpO2 98%   BMI 21.63 kg/m   Assessment and Plan: 1. Annual physical exam Normal exam - continue regular exercise - POCT urinalysis dipstick  2. Essential (primary) hypertension Controlled May need to substitute for valsartan - Comprehensive metabolic panel - TSH  3. Esophagitis, reflux Controlled on OTC medication - CBC with Differential/Platelet  4. Dyslipidemia Not on statin therapy due to severe back pain from previous therapy Since she is s/p R CEA - she will continue [facility] screening annually (formal carotid US too $) - Lipid panel  5. History of right breast cancer S/p bilateral total mastectomies so no further mammograms are needed   No orders of the defined types were placed in this encounter.   Halina Maidens, MD [facility]  02/07/2017

## 2017-02-08 LAB — CBC WITH DIFFERENTIAL/PLATELET
BASOS ABS: 0 10*3/uL (ref 0.0–0.2)
BASOS: 0 %
EOS (ABSOLUTE): 0.1 10*3/uL (ref 0.0–0.4)
Eos: 2 %
Hematocrit: 42.3 % (ref 34.0–46.6)
Hemoglobin: 14.1 g/dL (ref 11.1–15.9)
IMMATURE GRANULOCYTES: 0 %
Immature Grans (Abs): 0 10*3/uL (ref 0.0–0.1)
LYMPHS: 39 %
Lymphocytes Absolute: 2.1 10*3/uL (ref 0.7–3.1)
MCH: 31.5 pg (ref 26.6–33.0)
MCHC: 33.3 g/dL (ref 31.5–35.7)
MCV: 94 fL (ref 79–97)
MONOS ABS: 0.3 10*3/uL (ref 0.1–0.9)
Monocytes: 5 %
NEUTROS ABS: 2.9 10*3/uL (ref 1.4–7.0)
NEUTROS PCT: 54 %
PLATELETS: 270 10*3/uL (ref 150–379)
RBC: 4.48 x10E6/uL (ref 3.77–5.28)
RDW: 13.9 % (ref 12.3–15.4)
WBC: 5.4 10*3/uL (ref 3.4–10.8)

## 2017-02-08 LAB — COMPREHENSIVE METABOLIC PANEL
A/G RATIO: 2.2 (ref 1.2–2.2)
ALT: 18 IU/L (ref 0–32)
AST: 22 IU/L (ref 0–40)
Albumin: 4.9 g/dL — ABNORMAL HIGH (ref 3.6–4.8)
Alkaline Phosphatase: 76 IU/L (ref 39–117)
BILIRUBIN TOTAL: 0.4 mg/dL (ref 0.0–1.2)
BUN/Creatinine Ratio: 25 (ref 12–28)
BUN: 17 mg/dL (ref 8–27)
CHLORIDE: 100 mmol/L (ref 96–106)
CO2: 24 mmol/L (ref 20–29)
Calcium: 9.6 mg/dL (ref 8.7–10.3)
Creatinine, Ser: 0.69 mg/dL (ref 0.57–1.00)
GFR calc Af Amer: 107 mL/min/{1.73_m2} (ref >59)
GFR calc non Af Amer: 93 mL/min/{1.73_m2} (ref >59)
GLUCOSE: 90 mg/dL (ref 65–99)
Globulin, Total: 2.2 g/dL (ref 1.5–4.5)
POTASSIUM: 4.1 mmol/L (ref 3.5–5.2)
Sodium: 141 mmol/L (ref 134–144)
Total Protein: 7.1 g/dL (ref 6.0–8.5)

## 2017-02-08 LAB — LIPID PANEL
CHOLESTEROL TOTAL: 310 mg/dL — AB (ref 100–199)
Chol/HDL Ratio: 1.9 ratio (ref 0.0–4.4)
HDL: 160 mg/dL (ref >39)
LDL Calculated: 141 mg/dL — ABNORMAL HIGH (ref 0–99)
TRIGLYCERIDES: 46 mg/dL (ref 0–149)
VLDL Cholesterol Cal: 9 mg/dL (ref 5–40)

## 2017-02-08 LAB — TSH: TSH: 1.57 u[IU]/mL (ref 0.450–4.500)

## 2017-02-13 ENCOUNTER — Telehealth: Payer: Self-pay

## 2017-02-13 NOTE — Telephone Encounter (Signed)
Pt called about recent cholesterol labs- wants to know if she should be put on Statin medication? Please Advise.

## 2017-02-13 NOTE — Telephone Encounter (Signed)
She does not need medication based on the very high HDL.

## 2017-02-13 NOTE — Telephone Encounter (Signed)
Pt informed of no meds needed due to high HDL

## 2017-02-15 ENCOUNTER — Other Ambulatory Visit: Payer: Self-pay | Admitting: Internal Medicine

## 2017-02-15 ENCOUNTER — Telehealth: Payer: Self-pay

## 2017-02-15 MED ORDER — OLMESARTAN MEDOXOMIL 40 MG PO TABS: 40.0000 mg | ORAL_TABLET | Freq: Every day | ORAL | 3 refills | Status: DC

## 2017-02-15 NOTE — Telephone Encounter (Signed)
Pt calling stating needs other drug besides valsartan due to recall. Please advise.

## 2017-02-15 NOTE — Telephone Encounter (Signed)
I sent in a substitute Olmesartan 40 mg to mail order.

## 2017-02-15 NOTE — Telephone Encounter (Signed)
Pt informed

## 2017-02-16 ENCOUNTER — Telehealth: Payer: Self-pay

## 2017-02-16 ENCOUNTER — Other Ambulatory Visit: Payer: Self-pay

## 2017-02-16 MED ORDER — OLMESARTAN MEDOXOMIL 40 MG PO TABS: 40.0000 mg | ORAL_TABLET | Freq: Every day | ORAL | 0 refills | Status: DC

## 2017-02-16 NOTE — Telephone Encounter (Signed)
Pt called stating she did not want to be w/o BP meds for a week. Pt was sent in new Rx for Bp meds due to valsartan recall. - Asking if we can send in a short supply to [facility] in [place] until Mail order ships her new Rx. I sent in 30 days of Benicar 40 mg to [facility] for pt.

## 2017-02-27 ENCOUNTER — Other Ambulatory Visit: Payer: Self-pay | Admitting: Internal Medicine

## 2017-07-25 DIAGNOSIS — K52831 Collagenous colitis: Secondary | ICD-10-CM | POA: Insufficient documentation

## 2017-10-26 ENCOUNTER — Other Ambulatory Visit: Payer: Self-pay

## 2017-10-26 MED ORDER — OLMESARTAN MEDOXOMIL 40 MG PO TABS: 40.0000 mg | ORAL_TABLET | Freq: Every day | ORAL | 0 refills | Status: DC

## 2017-12-11 ENCOUNTER — Ambulatory Visit: Payer: BC Managed Care – PPO | Admitting: Internal Medicine

## 2017-12-11 ENCOUNTER — Encounter: Payer: Self-pay | Admitting: Internal Medicine

## 2017-12-11 VITALS — BP 117/77 | HR 76 | Resp 16 | Ht 66.0 in | Wt 133.2 lb

## 2017-12-11 DIAGNOSIS — R197 Diarrhea, unspecified: Secondary | ICD-10-CM

## 2017-12-11 NOTE — Progress Notes (Signed)
Date:  12/11/2017   Name:  Sharon Ayers   DOB:  [birth year]   MRN:  [id number]   Chief Complaint: Diarrhea (Issues started in January but thought it was lettuce. Now still having loose stools but does not feel sick at all. ) and Abdominal Pain (Had severe pain Thur PM and went to [facility] for imaging that could not find cause. Now better no pain. )  Diarrhea   This is a new problem. The current episode started more than 1 month ago. The problem occurs less than 2 times per day. The problem has been gradually worsening. The stool consistency is described as watery. Associated symptoms include abdominal pain. Pertinent negatives include no chills, coughing, fever, headaches or weight loss.   Last week she had an episode of severe RUA pain.  She went to the ED - labs were normal.  US showed normal gall bladder.  Review of Systems  Constitutional: Negative for chills, fatigue, fever, unexpected weight change and weight loss.  Respiratory: Negative for cough, chest tightness and shortness of breath.   Cardiovascular: Negative for chest pain and palpitations.  Gastrointestinal: Positive for abdominal pain and diarrhea.  Neurological: Negative for dizziness and headaches.  Hematological: Negative for adenopathy.    Patient Active Problem List   Diagnosis Date Noted  . Dyslipidemia 05/27/2015  . Essential (primary) hypertension 05/27/2015  . Generalized OA 05/27/2015  . Esophagitis, reflux 05/27/2015  . Calcium blood increased 05/27/2015  . OP (osteoporosis) 05/27/2015  . History of right breast cancer 03/12/2014  . Abnormal Pap smear of cervix 03/26/2013  . Carotid artery narrowing 11/27/2012    Prior to Admission medications   Medication Sig Start Date End Date Taking? Authorizing Provider  aspirin 325 MG tablet Take 1 tablet by mouth daily.   Yes [provider]  hydrochlorothiazide (MICROZIDE) 12.5 MG capsule TAKE 1 CAPSULE DAILY 01/27/17  Yes Glean Hess, MD  MULTIPLE VITAMIN PO Take by mouth.   Yes [provider]  olmesartan (BENICAR) 40 MG tablet Take 1 tablet (40 mg total) by mouth daily. 10/26/17  Yes Glean Hess, MD  Omega-3 Fatty Acids (FISH OIL BURP-LESS) 1000 MG CAPS Take 1 capsule by mouth daily.   Yes [provider]  ZEGERID OTC 20-1100 MG CAPS capsule TAKE 1 CAPSULE BY MOUTH EVERY DAY 12/25/14  Yes Glean Hess, MD    Allergies  Allergen Reactions  . Diclofenac Diarrhea and Nausea And Vomiting  . Ace Inhibitors Cough and Other (See Comments)    Other reaction(s): Cough  . Atorvastatin Other (See Comments)    Severe back pain Severe back pain    Past Surgical History:  Procedure Laterality Date  . BILATERAL TOTAL MASTECTOMY WITH AXILLARY LYMPH NODE DISSECTION  05/2014   DCIS right breast ER/PR-  . CAROTID ENDARTERECTOMY  2009  . COLONOSCOPY  2008    Social History   Tobacco Use  . Smoking status: Never Smoker  . Smokeless tobacco: Never Used  Substance Use Topics  . Alcohol use: Yes    Alcohol/week: 1.2 oz    Types: 2 Standard drinks or equivalent per week  . Drug use: Not on file     Medication list has been reviewed and updated.  PHQ 2/9 Scores 02/07/2017 11/23/2015  PHQ - 2 Score 0 0    Physical Exam  Constitutional: She is oriented to person, place, and time. She appears well-developed. No distress.  HENT:  Head: Normocephalic and atraumatic.  Cardiovascular: Normal rate, regular rhythm and normal heart sounds.  Pulmonary/Chest: Effort normal and breath sounds normal. No respiratory distress.  Abdominal: Soft. Normal appearance. She exhibits no distension and no mass. There is no hepatosplenomegaly. There is no tenderness. There is no rigidity and no guarding.  Musculoskeletal: Normal range of motion.  Neurological: She is alert and oriented to person, place, and time.  Skin: Skin is warm and dry. No rash noted.  Psychiatric: She has a normal mood and affect. Her behavior  is normal. Thought content normal.  Nursing note and vitals reviewed.   BP 117/77   Pulse 76   Resp 16   Ht 5\' 6"  (1.676 m)   Wt 133 lb 3.2 oz (60.4 kg)   SpO2 98%   BMI 21.50 kg/m   Assessment and Plan: 1. Diarrhea, unspecified type Begin fiber supplement 1-2 times per day Refer to GI if sx worsen - Cdiff NAA+O+P+Stool Culture   No orders of the defined types were placed in this encounter.   Partially dictated using Editor, commissioning. Any errors are unintentional.  Halina Maidens, MD [facility]  12/11/2017

## 2017-12-11 NOTE — Patient Instructions (Signed)
Benefiber once a day for 1-2 weeks, can increase to twice a day if needed.

## 2017-12-15 LAB — CDIFF NAA+O+P+STOOL CULTURE

## 2017-12-15 LAB — CLOSTRIDIUM DIFFICILE EIA

## 2017-12-15 LAB — SPECIMEN STATUS REPORT

## 2017-12-19 ENCOUNTER — Other Ambulatory Visit: Payer: Self-pay | Admitting: Internal Medicine

## 2017-12-19 DIAGNOSIS — R197 Diarrhea, unspecified: Secondary | ICD-10-CM

## 2017-12-25 ENCOUNTER — Other Ambulatory Visit: Payer: Self-pay

## 2017-12-25 ENCOUNTER — Telehealth: Payer: Self-pay | Admitting: Internal Medicine

## 2017-12-25 NOTE — Telephone Encounter (Signed)
C-diff will not be completed due to wrong container "orange being sent in" they need the sterile container "white" container.

## 2017-12-25 NOTE — Telephone Encounter (Signed)
Please call [facility] and find out the status of her stool studies from last week.

## 2017-12-26 LAB — CDIFF NAA+O+P+STOOL CULTURE: E COLI SHIGA TOXIN ASSAY: NEGATIVE

## 2017-12-29 ENCOUNTER — Other Ambulatory Visit: Payer: Self-pay | Admitting: Internal Medicine

## 2017-12-29 DIAGNOSIS — R197 Diarrhea, unspecified: Secondary | ICD-10-CM

## 2018-01-01 ENCOUNTER — Other Ambulatory Visit: Payer: Self-pay | Admitting: Internal Medicine

## 2018-01-01 ENCOUNTER — Telehealth: Payer: Self-pay

## 2018-01-01 DIAGNOSIS — I10 Essential (primary) hypertension: Secondary | ICD-10-CM

## 2018-01-01 MED ORDER — VALSARTAN 320 MG PO TABS: 320.0000 mg | ORAL_TABLET | Freq: Every day | ORAL | 0 refills | Status: DC

## 2018-01-01 NOTE — Telephone Encounter (Signed)
Patient was taking 1/2 tablet of Benicar sicne she is having diarrhea when she is taking whole tablet. She is not wanting to continue Benicar because it caused her to have extreme abdominal pain with diarrhea, so wants alternative. She would like it to go to [facility] in [place]. She is having a referral to see her GI specialist.

## 2018-01-01 NOTE — Telephone Encounter (Signed)
LMOM for patient to call back.

## 2018-01-01 NOTE — Telephone Encounter (Signed)
Patient notified

## 2018-01-01 NOTE — Telephone Encounter (Signed)
Sent in diovan that she took in the past with no problems.

## 2018-01-16 ENCOUNTER — Other Ambulatory Visit: Payer: Self-pay | Admitting: Internal Medicine

## 2018-01-23 ENCOUNTER — Other Ambulatory Visit: Payer: Self-pay | Admitting: Internal Medicine

## 2018-01-23 DIAGNOSIS — I10 Essential (primary) hypertension: Secondary | ICD-10-CM

## 2018-02-08 ENCOUNTER — Encounter: Payer: Self-pay | Admitting: Internal Medicine

## 2018-02-08 ENCOUNTER — Ambulatory Visit (INDEPENDENT_AMBULATORY_CARE_PROVIDER_SITE_OTHER): Payer: BC Managed Care – PPO | Admitting: Internal Medicine

## 2018-02-08 VITALS — BP 108/64 | HR 75 | Ht 66.0 in | Wt 137.8 lb

## 2018-02-08 DIAGNOSIS — I10 Essential (primary) hypertension: Secondary | ICD-10-CM | POA: Diagnosis not present

## 2018-02-08 DIAGNOSIS — E785 Hyperlipidemia, unspecified: Secondary | ICD-10-CM

## 2018-02-08 DIAGNOSIS — R197 Diarrhea, unspecified: Secondary | ICD-10-CM

## 2018-02-08 DIAGNOSIS — Z0001 Encounter for general adult medical examination with abnormal findings: Secondary | ICD-10-CM

## 2018-02-08 DIAGNOSIS — Z0000 Encounter for general adult medical examination without abnormal findings: Secondary | ICD-10-CM

## 2018-02-08 DIAGNOSIS — K2100 Gastro-esophageal reflux disease with esophagitis, without bleeding: Secondary | ICD-10-CM

## 2018-02-08 DIAGNOSIS — I6521 Occlusion and stenosis of right carotid artery: Secondary | ICD-10-CM | POA: Diagnosis not present

## 2018-02-08 LAB — POCT URINALYSIS DIPSTICK
BILIRUBIN UA: NEGATIVE
GLUCOSE UA: NEGATIVE
KETONES UA: NEGATIVE
Leukocytes, UA: NEGATIVE
Nitrite, UA: NEGATIVE
Protein, UA: NEGATIVE
RBC UA: NEGATIVE
SPEC GRAV UA: 1.01 (ref 1.010–1.025)
Urobilinogen, UA: 0.2 E.U./dL
pH, UA: 7.5 (ref 5.0–8.0)

## 2018-02-08 MED ORDER — OMEPRAZOLE-SODIUM BICARBONATE 20-1100 MG PO CAPS: 1.0000 | ORAL_CAPSULE | Freq: Every day | ORAL | 3 refills | Status: AC

## 2018-02-08 MED ORDER — VALSARTAN 320 MG PO TABS: 320.0000 mg | ORAL_TABLET | Freq: Every day | ORAL | 5 refills | Status: DC

## 2018-02-08 MED ORDER — HYDROCHLOROTHIAZIDE 12.5 MG PO CAPS: 12.5000 mg | ORAL_CAPSULE | Freq: Every day | ORAL | 3 refills | Status: DC

## 2018-02-08 NOTE — Patient Instructions (Signed)
DASH Eating Plan DASH stands for "Dietary Approaches to Stop Hypertension." The DASH eating plan is a healthy eating plan that has been shown to reduce high blood pressure (hypertension). It may also reduce your risk for type 2 diabetes, heart disease, and stroke. The DASH eating plan may also help with weight loss. What are tips for following this plan? General guidelines  Avoid eating more than 2,300 mg (milligrams) of salt (sodium) a day. If you have hypertension, you may need to reduce your sodium intake to 1,500 mg a day.  Limit alcohol intake to no more than 1 drink a day for nonpregnant women and 2 drinks a day for men. One drink equals 12 oz of beer, 5 oz of wine, or 1 oz of hard liquor.  Work with your health care provider to maintain a healthy body weight or to lose weight. Ask what an ideal weight is for you.  Get at least 30 minutes of exercise that causes your heart to beat faster (aerobic exercise) most days of the week. Activities may include walking, swimming, or biking.  Work with your health care provider or diet and nutrition specialist (dietitian) to adjust your eating plan to your individual calorie needs. Reading food labels  Check food labels for the amount of sodium per serving. Choose foods with less than 5 percent of the Daily Value of sodium. Generally, foods with less than 300 mg of sodium per serving fit into this eating plan.  To find whole grains, look for the word "whole" as the first word in the ingredient list. Shopping  Buy products labeled as "low-sodium" or "no salt added."  Buy fresh foods. Avoid canned foods and premade or frozen meals. Cooking  Avoid adding salt when cooking. Use salt-free seasonings or herbs instead of table salt or sea salt. Check with your health care provider or pharmacist before using salt substitutes.  Do not fry foods. Cook foods using healthy methods such as baking, boiling, grilling, and broiling instead.  Cook with  heart-healthy oils, such as olive, canola, soybean, or sunflower oil. Meal planning   Eat a balanced diet that includes: ? 5 or more servings of fruits and vegetables each day. At each meal, try to fill half of your plate with fruits and vegetables. ? Up to 6-8 servings of whole grains each day. ? Less than 6 oz of lean meat, poultry, or fish each day. A 3-oz serving of meat is about the same size as a deck of cards. One egg equals 1 oz. ? 2 servings of low-fat dairy each day. ? A serving of nuts, seeds, or beans 5 times each week. ? Heart-healthy fats. Healthy fats called Omega-3 fatty acids are found in foods such as flaxseeds and coldwater fish, like sardines, salmon, and mackerel.  Limit how much you eat of the following: ? Canned or prepackaged foods. ? Food that is high in trans fat, such as fried foods. ? Food that is high in saturated fat, such as fatty meat. ? Sweets, desserts, sugary drinks, and other foods with added sugar. ? Full-fat dairy products.  Do not salt foods before eating.  Try to eat at least 2 vegetarian meals each week.  Eat more home-cooked food and less restaurant, buffet, and fast food.  When eating at a restaurant, ask that your food be prepared with less salt or no salt, if possible. What foods are recommended? The items listed may not be a complete list. Talk with your dietitian about what   dietary choices are best for you. Grains Whole-grain or whole-wheat bread. Whole-grain or whole-wheat pasta. Brown rice. Oatmeal. Quinoa. Bulgur. Whole-grain and low-sodium cereals. Pita bread. Low-fat, low-sodium crackers. Whole-wheat flour tortillas. Vegetables Fresh or frozen vegetables (raw, steamed, roasted, or grilled). Low-sodium or reduced-sodium tomato and vegetable juice. Low-sodium or reduced-sodium tomato sauce and tomato paste. Low-sodium or reduced-sodium canned vegetables. Fruits All fresh, dried, or frozen fruit. Canned fruit in natural juice (without  added sugar). Meat and other protein foods Skinless chicken or turkey. Ground chicken or turkey. Pork with fat trimmed off. Fish and seafood. Egg whites. Dried beans, peas, or lentils. Unsalted nuts, nut butters, and seeds. Unsalted canned beans. Lean cuts of beef with fat trimmed off. Low-sodium, lean deli meat. Dairy Low-fat (1%) or fat-free (skim) milk. Fat-free, low-fat, or reduced-fat cheeses. Nonfat, low-sodium ricotta or cottage cheese. Low-fat or nonfat yogurt. Low-fat, low-sodium cheese. Fats and oils Soft margarine without trans fats. Vegetable oil. Low-fat, reduced-fat, or light mayonnaise and salad dressings (reduced-sodium). Canola, safflower, olive, soybean, and sunflower oils. Avocado. Seasoning and other foods Herbs. Spices. Seasoning mixes without salt. Unsalted popcorn and pretzels. Fat-free sweets. What foods are not recommended? The items listed may not be a complete list. Talk with your dietitian about what dietary choices are best for you. Grains Baked goods made with fat, such as croissants, muffins, or some breads. Dry pasta or rice meal packs. Vegetables Creamed or fried vegetables. Vegetables in a cheese sauce. Regular canned vegetables (not low-sodium or reduced-sodium). Regular canned tomato sauce and paste (not low-sodium or reduced-sodium). Regular tomato and vegetable juice (not low-sodium or reduced-sodium). Pickles. Olives. Fruits Canned fruit in a light or heavy syrup. Fried fruit. Fruit in cream or butter sauce. Meat and other protein foods Fatty cuts of meat. Ribs. Fried meat. Bacon. Sausage. Bologna and other processed lunch meats. Salami. Fatback. Hotdogs. Bratwurst. Salted nuts and seeds. Canned beans with added salt. Canned or smoked fish. Whole eggs or egg yolks. Chicken or turkey with skin. Dairy Whole or 2% milk, cream, and half-and-half. Whole or full-fat cream cheese. Whole-fat or sweetened yogurt. Full-fat cheese. Nondairy creamers. Whipped toppings.  Processed cheese and cheese spreads. Fats and oils Butter. Stick margarine. Lard. Shortening. Ghee. Bacon fat. Tropical oils, such as coconut, palm kernel, or palm oil. Seasoning and other foods Salted popcorn and pretzels. Onion salt, garlic salt, seasoned salt, table salt, and sea salt. Worcestershire sauce. Tartar sauce. Barbecue sauce. Teriyaki sauce. Soy sauce, including reduced-sodium. Steak sauce. Canned and packaged gravies. Fish sauce. Oyster sauce. Cocktail sauce. Horseradish that you find on the shelf. Ketchup. Mustard. Meat flavorings and tenderizers. Bouillon cubes. Hot sauce and Tabasco sauce. Premade or packaged marinades. Premade or packaged taco seasonings. Relishes. Regular salad dressings. Where to find more information:  National Heart, Lung, and Blood Institute: www.nhlbi.nih.gov  American Heart Association: www.heart.org Summary  The DASH eating plan is a healthy eating plan that has been shown to reduce high blood pressure (hypertension). It may also reduce your risk for type 2 diabetes, heart disease, and stroke.  With the DASH eating plan, you should limit salt (sodium) intake to 2,300 mg a day. If you have hypertension, you may need to reduce your sodium intake to 1,500 mg a day.  When on the DASH eating plan, aim to eat more fresh fruits and vegetables, whole grains, lean proteins, low-fat dairy, and heart-healthy fats.  Work with your health care provider or diet and nutrition specialist (dietitian) to adjust your eating plan to your individual   calorie needs. This information is not intended to replace advice given to you by your health care provider. Make sure you discuss any questions you have with your health care provider. Document Released: 06/30/2011 Document Revised: 07/04/2016 Document Reviewed: 07/04/2016 Elsevier Interactive Patient Education  2018 Elsevier Inc.  

## 2018-02-08 NOTE — Progress Notes (Signed)
Date:  02/08/2018   Name:  Sharon Ayers   DOB:  31-Jan-1951   MRN:  [id number]   Chief Complaint: Annual Exam and Hypertension Sharon Ayers is a 67 y.o. female who presents today for her Complete Annual Exam. She feels well. She reports exercising daily - walks for an hour. She reports she is sleeping well. She does not get mammograms.  She is due for pap smear but sees GYN in September.  Hypertension  The problem is controlled. Pertinent negatives include no chest pain, headaches, palpitations or shortness of breath. Past treatments include diuretics and angiotensin blockers. The current treatment provides significant improvement.  Gastroesophageal Reflux  She complains of heartburn. She reports no abdominal pain, no chest pain, no coughing or no wheezing. This is a recurrent problem. The problem occurs occasionally. Pertinent negatives include no fatigue. She has tried a PPI for the symptoms. The treatment provided significant relief.  Diarrhea - still ongoing, seen by GI.  Had moderate amount of stool in colon so this week did a clean out.  She also tried to stop zegerid but had too much reflux.  She also tried Levsin but did not notice any help.    Review of Systems  Constitutional: Negative for chills, fatigue, fever and unexpected weight change.  HENT: Negative for congestion, hearing loss, tinnitus, trouble swallowing and voice change.   Eyes: Negative for visual disturbance.  Respiratory: Negative for cough, chest tightness, shortness of breath and wheezing.   Cardiovascular: Negative for chest pain, palpitations and leg swelling.  Gastrointestinal: Positive for diarrhea and heartburn. Negative for abdominal pain, constipation and vomiting.  Endocrine: Negative for polydipsia and polyuria.  Genitourinary: Negative for dysuria, frequency, genital sores, vaginal bleeding and vaginal discharge.  Musculoskeletal: Negative for arthralgias, gait problem and joint swelling.    Skin: Negative for color change and rash.  Neurological: Negative for dizziness, tremors, light-headedness and headaches.  Hematological: Negative for adenopathy. Does not bruise/bleed easily.  Psychiatric/Behavioral: Negative for dysphoric mood and sleep disturbance. The patient is not nervous/anxious.     Patient Active Problem List   Diagnosis Date Noted  . Dyslipidemia 05/27/2015  . Essential (primary) hypertension 05/27/2015  . Generalized OA 05/27/2015  . Esophagitis, reflux 05/27/2015  . Calcium blood increased 05/27/2015  . OP (osteoporosis) 05/27/2015  . History of right breast cancer 03/12/2014  . Abnormal Pap smear of cervix 03/26/2013  . Carotid artery narrowing 11/27/2012    Prior to Admission medications   Medication Sig Start Date End Date Taking? Authorizing Provider  aspirin 325 MG tablet Take 1 tablet by mouth daily.    [provider]       Glean Hess, MD  hydrochlorothiazide (MICROZIDE) 12.5 MG capsule TAKE 1 CAPSULE DAILY 01/16/18   Juline Patch, MD  MULTIPLE VITAMIN PO Take by mouth.    [provider]  Omega-3 Fatty Acids (FISH OIL BURP-LESS) 1000 MG CAPS Take 1 capsule by mouth daily.    [provider]  valsartan (DIOVAN) 320 MG tablet TAKE 1 TABLET(320 MG) BY MOUTH DAILY 01/23/18   Glean Hess, MD  ZEGERID OTC 20-1100 MG CAPS capsule TAKE 1 CAPSULE BY MOUTH EVERY DAY 12/25/14   Glean Hess, MD    Allergies  Allergen Reactions  . Diclofenac Diarrhea and Nausea And Vomiting  . Ace Inhibitors Cough and Other (See Comments)    Other reaction(s): Cough  . Atorvastatin Other (See Comments)    Severe back pain Severe  back pain  . Olmesartan Diarrhea    Past Surgical History:  Procedure Laterality Date  . BILATERAL TOTAL MASTECTOMY WITH AXILLARY LYMPH NODE DISSECTION  05/2014   DCIS right breast ER/PR-  . CAROTID ENDARTERECTOMY  2009  . COLONOSCOPY  2008    Social History   Tobacco Use  . Smoking  status: Never Smoker  . Smokeless tobacco: Never Used  Substance Use Topics  . Alcohol use: Yes    Alcohol/week: 1.2 oz    Types: 2 Standard drinks or equivalent per week  . Drug use: Not on file     Medication list has been reviewed and updated.  Current Meds  Medication Sig  . aspirin 325 MG tablet Take 1 tablet by mouth daily.  . hydrochlorothiazide (MICROZIDE) 12.5 MG capsule TAKE 1 CAPSULE DAILY  . MULTIPLE VITAMIN PO Take by mouth.  . Omega-3 Fatty Acids (FISH OIL BURP-LESS) 1000 MG CAPS Take 1 capsule by mouth daily.  . valsartan (DIOVAN) 320 MG tablet TAKE 1 TABLET(320 MG) BY MOUTH DAILY  . ZEGERID OTC 20-1100 MG CAPS capsule TAKE 1 CAPSULE BY MOUTH EVERY DAY    PHQ 2/9 Scores 02/08/2018 02/07/2017 11/23/2015  PHQ - 2 Score 0 0 0    Physical Exam  Constitutional: She is oriented to person, place, and time. She appears well-developed and well-nourished. No distress.  HENT:  Head: Normocephalic and atraumatic.  Right Ear: Tympanic membrane and ear canal normal.  Left Ear: Tympanic membrane and ear canal normal.  Nose: Right sinus exhibits no maxillary sinus tenderness. Left sinus exhibits no maxillary sinus tenderness.  Mouth/Throat: Uvula is midline and oropharynx is clear and moist.  Eyes: Conjunctivae and EOM are normal. Right eye exhibits no discharge. Left eye exhibits no discharge. No scleral icterus.  Neck: Normal range of motion. Carotid bruit is not present. No erythema present. No thyromegaly present.  Cardiovascular: Normal rate, regular rhythm, normal heart sounds and normal pulses.  Pulmonary/Chest: Effort normal. No respiratory distress. She has no wheezes.  Abdominal: Soft. Bowel sounds are normal. There is no hepatosplenomegaly. There is no tenderness. There is no CVA tenderness.  Musculoskeletal: Normal range of motion.  Lymphadenopathy:    She has no cervical adenopathy.    She has no axillary adenopathy.  Neurological: She is alert and oriented to  person, place, and time. She has normal reflexes. No cranial nerve deficit or sensory deficit.  Skin: Skin is warm, dry and intact. No rash noted.  Psychiatric: She has a normal mood and affect. Her speech is normal and behavior is normal. Thought content normal.  Nursing note and vitals reviewed.   BP 108/64   Pulse 75   Ht 5\' 6"  (1.676 m)   Wt 137 lb 12.8 oz (62.5 kg)   SpO2 100%   BMI 22.24 kg/m   Assessment and Plan: 1. Annual physical exam Normal exam Pap in September with GYN - POCT urinalysis dipstick  2. Essential (primary) hypertension controlled - Comprehensive metabolic panel - TSH - hydrochlorothiazide (MICROZIDE) 12.5 MG capsule; Take 1 capsule (12.5 mg total) by mouth daily.  Dispense: 90 capsule; Refill: 3 - valsartan (DIOVAN) 320 MG tablet; Take 1 tablet (320 mg total) by mouth daily.  Dispense: 30 tablet; Refill: 5  3. Stenosis of right carotid artery S/p CEA  4. Dyslipidemia Did not tolerate one statin tried briefly She is willing to try another if needed - Lipid panel  5. Esophagitis, reflux Continue zegerid - CBC with Differential/Platelet - Omeprazole-Sodium Bicarbonate (  ZEGERID OTC) 20-1100 MG CAPS capsule; Take 1 capsule by mouth daily.  Dispense: 90 capsule; Refill: 3  7. Diarrhea, unspecified type Having colonoscopy and work up with GI  Meds ordered this encounter  Medications  . hydrochlorothiazide (MICROZIDE) 12.5 MG capsule    Sig: Take 1 capsule (12.5 mg total) by mouth daily.    Dispense:  90 capsule    Refill:  3  . valsartan (DIOVAN) 320 MG tablet    Sig: Take 1 tablet (320 mg total) by mouth daily.    Dispense:  30 tablet    Refill:  5  . Omeprazole-Sodium Bicarbonate (ZEGERID OTC) 20-1100 MG CAPS capsule    Sig: Take 1 capsule by mouth daily.    Dispense:  90 capsule    Refill:  3    Partially dictated using Editor, commissioning. Any errors are unintentional.  Halina Maidens, MD [facility]  02/08/2018   There are no diagnoses linked to this encounter.

## 2018-02-09 ENCOUNTER — Other Ambulatory Visit: Payer: Self-pay | Admitting: Internal Medicine

## 2018-02-09 DIAGNOSIS — E785 Hyperlipidemia, unspecified: Secondary | ICD-10-CM

## 2018-02-09 LAB — COMPREHENSIVE METABOLIC PANEL
ALK PHOS: 89 IU/L (ref 39–117)
ALT: 15 IU/L (ref 0–32)
AST: 20 IU/L (ref 0–40)
Albumin/Globulin Ratio: 2.4 — ABNORMAL HIGH (ref 1.2–2.2)
Albumin: 4.5 g/dL (ref 3.6–4.8)
BUN/Creatinine Ratio: 23 (ref 12–28)
BUN: 16 mg/dL (ref 8–27)
Bilirubin Total: 0.3 mg/dL (ref 0.0–1.2)
CO2: 24 mmol/L (ref 20–29)
CREATININE: 0.71 mg/dL (ref 0.57–1.00)
Calcium: 9.5 mg/dL (ref 8.7–10.3)
Chloride: 97 mmol/L (ref 96–106)
GFR calc Af Amer: 104 mL/min/{1.73_m2} (ref >59)
GFR calc non Af Amer: 90 mL/min/{1.73_m2} (ref >59)
GLUCOSE: 71 mg/dL (ref 65–99)
Globulin, Total: 1.9 g/dL (ref 1.5–4.5)
Potassium: 4.2 mmol/L (ref 3.5–5.2)
SODIUM: 139 mmol/L (ref 134–144)
Total Protein: 6.4 g/dL (ref 6.0–8.5)

## 2018-02-09 LAB — LIPID PANEL
CHOLESTEROL TOTAL: 244 mg/dL — AB (ref 100–199)
Chol/HDL Ratio: 2.4 ratio (ref 0.0–4.4)
HDL: 103 mg/dL (ref >39)
LDL CALC: 125 mg/dL — AB (ref 0–99)
TRIGLYCERIDES: 81 mg/dL (ref 0–149)
VLDL CHOLESTEROL CAL: 16 mg/dL (ref 5–40)

## 2018-02-09 LAB — TSH: TSH: 1.36 u[IU]/mL (ref 0.450–4.500)

## 2018-02-09 LAB — CBC WITH DIFFERENTIAL/PLATELET
BASOS: 1 %
Basophils Absolute: 0 10*3/uL (ref 0.0–0.2)
EOS (ABSOLUTE): 0.2 10*3/uL (ref 0.0–0.4)
EOS: 3 %
Hematocrit: 39.2 % (ref 34.0–46.6)
Hemoglobin: 12.6 g/dL (ref 11.1–15.9)
Immature Grans (Abs): 0 10*3/uL (ref 0.0–0.1)
Immature Granulocytes: 0 %
LYMPHS: 32 %
Lymphocytes Absolute: 2 10*3/uL (ref 0.7–3.1)
MCH: 30.4 pg (ref 26.6–33.0)
MCHC: 32.1 g/dL (ref 31.5–35.7)
MCV: 95 fL (ref 79–97)
Monocytes Absolute: 0.5 10*3/uL (ref 0.1–0.9)
Monocytes: 8 %
NEUTROS ABS: 3.5 10*3/uL (ref 1.4–7.0)
Neutrophils: 56 %
Platelets: 260 10*3/uL (ref 150–450)
RBC: 4.14 x10E6/uL (ref 3.77–5.28)
RDW: 12.6 % (ref 12.3–15.4)
WBC: 6.2 10*3/uL (ref 3.4–10.8)

## 2018-02-09 MED ORDER — PRAVASTATIN SODIUM 20 MG PO TABS: 20.0000 mg | ORAL_TABLET | Freq: Every day | ORAL | 1 refills | Status: DC

## 2018-02-19 ENCOUNTER — Other Ambulatory Visit: Payer: Self-pay

## 2018-02-19 DIAGNOSIS — I10 Essential (primary) hypertension: Secondary | ICD-10-CM

## 2018-02-19 MED ORDER — VALSARTAN 320 MG PO TABS: 320.0000 mg | ORAL_TABLET | Freq: Every day | ORAL | 1 refills | Status: DC

## 2018-03-20 ENCOUNTER — Encounter: Payer: Self-pay | Admitting: Internal Medicine

## 2018-03-20 LAB — HM COLONOSCOPY

## 2018-06-15 ENCOUNTER — Other Ambulatory Visit: Payer: Self-pay

## 2018-06-15 DIAGNOSIS — E785 Hyperlipidemia, unspecified: Secondary | ICD-10-CM

## 2018-06-15 DIAGNOSIS — I10 Essential (primary) hypertension: Secondary | ICD-10-CM

## 2018-06-15 MED ORDER — PRAVASTATIN SODIUM 20 MG PO TABS: 20.0000 mg | ORAL_TABLET | Freq: Every day | ORAL | 1 refills | Status: DC

## 2018-06-15 MED ORDER — HYDROCHLOROTHIAZIDE 12.5 MG PO CAPS: 12.5000 mg | ORAL_CAPSULE | Freq: Every day | ORAL | 1 refills | Status: DC

## 2018-06-15 MED ORDER — VALSARTAN 320 MG PO TABS: 320.0000 mg | ORAL_TABLET | Freq: Every day | ORAL | 1 refills | Status: DC

## 2018-06-19 LAB — HM PAP SMEAR: HM PAP: NEGATIVE

## 2018-08-14 ENCOUNTER — Encounter: Payer: Self-pay | Admitting: Internal Medicine

## 2018-08-14 ENCOUNTER — Ambulatory Visit (INDEPENDENT_AMBULATORY_CARE_PROVIDER_SITE_OTHER): Payer: Medicare Other | Admitting: Internal Medicine

## 2018-08-14 VITALS — BP 108/68 | HR 95 | Ht 66.0 in | Wt 142.2 lb

## 2018-08-14 DIAGNOSIS — I10 Essential (primary) hypertension: Secondary | ICD-10-CM | POA: Diagnosis not present

## 2018-08-14 DIAGNOSIS — E785 Hyperlipidemia, unspecified: Secondary | ICD-10-CM

## 2018-08-14 DIAGNOSIS — E2839 Other primary ovarian failure: Secondary | ICD-10-CM

## 2018-08-14 DIAGNOSIS — Z23 Encounter for immunization: Secondary | ICD-10-CM | POA: Diagnosis not present

## 2018-08-14 NOTE — Patient Instructions (Signed)
Pneumococcal Conjugate Vaccine (PCV13): What You Need to Know  1. Why get vaccinated?  Vaccination can protect both children and adults from pneumococcal disease.  Pneumococcal disease is caused by bacteria that can spread from person to person through close contact. It can cause ear infections, and it can also lead to more serious infections of the:  · Lungs (pneumonia),  · Blood (bacteremia), and  · Covering of the brain and spinal cord (meningitis).  Pneumococcal pneumonia is most common among adults. Pneumococcal meningitis can cause deafness and brain damage, and it kills about 1 child in 10 who get it.  Anyone can get pneumococcal disease, but children under 2 years of age and adults 65 years and older, people with certain medical conditions, and cigarette smokers are at the highest risk.  Before there was a vaccine, the [place] saw:  · more than 700 cases of meningitis,  · about 13,000 blood infections,  · about 5 million ear infections, and  · about 200 deaths  in children under 5 each year from pneumococcal disease. Since vaccine became available, severe pneumococcal disease in these children has fallen by 88%.  About 18,000 older adults die of pneumococcal disease each year in the [place].  Treatment of pneumococcal infections with penicillin and other drugs is not as effective as it used to be, because some strains of the disease have become resistant to these drugs. This makes prevention of the disease, through vaccination, even more important.  2. PCV13 vaccine  Pneumococcal conjugate vaccine (called PCV13) protects against 13 types of pneumococcal bacteria.  PCV13 is routinely given to children at 2, 4, 6, and 12-15 months of age. It is also recommended for children and adults 2 to 64 years of age with certain health conditions, and for all adults 65 years of age and older. Your doctor can give you details.  3. Some people should not get this vaccine  Anyone who has ever had a  life-threatening allergic reaction to a dose of this vaccine, to an earlier pneumococcal vaccine called PCV7, or to any vaccine containing diphtheria toxoid (for example, DTaP), should not get PCV13.  Anyone with a severe allergy to any component of PCV13 should not get the vaccine. Tell your doctor if the person being vaccinated has any severe allergies.  If the person scheduled for vaccination is not feeling well, your healthcare provider might decide to reschedule the shot on another day.  4. Risks of a vaccine reaction  With any medicine, including vaccines, there is a chance of reactions. These are usually mild and go away on their own, but serious reactions are also possible.  Problems reported following PCV13 varied by age and dose in the series. The most common problems reported among children were:  · About half became drowsy after the shot, had a temporary loss of appetite, or had redness or tenderness where the shot was given.  · About 1 out of 3 had swelling where the shot was given.  · About 1 out of 3 had a mild fever, and about 1 in 20 had a fever over 102.2°F.  · Up to about 8 out of 10 became fussy or irritable.  Adults have reported pain, redness, and swelling where the shot was given; also mild fever, fatigue, headache, chills, or muscle pain.  Young children who get PCV13 along with inactivated flu vaccine at the same time may be at increased risk for seizures caused by fever. Ask your doctor for more   information.  Problems that could happen after any vaccine:  · People sometimes faint after a medical procedure, including vaccination. Sitting or lying down for about 15 minutes can help prevent fainting, and injuries caused by a fall. Tell your doctor if you feel dizzy, or have vision changes or ringing in the ears.  · Some older children and adults get severe pain in the shoulder and have difficulty moving the arm where a shot was given. This happens very rarely.  · Any medication can cause a  severe allergic reaction. Such reactions from a vaccine are very rare, estimated at about 1 in a million doses, and would happen within a few minutes to a few hours after the vaccination.  As with any medicine, there is a very small chance of a vaccine causing a serious injury or death.  The safety of vaccines is always being monitored. For more information, visit: www.cdc.gov/vaccinesafety/  5. What if there is a serious reaction?  What should I look for?  · Look for anything that concerns you, such as signs of a severe allergic reaction, very high fever, or unusual behavior.  Signs of a severe allergic reaction can include hives, swelling of the face and throat, difficulty breathing, a fast heartbeat, dizziness, and weakness--usually within a few minutes to a few hours after the vaccination.  What should I do?  · If you think it is a severe allergic reaction or other emergency that can't wait, call 9-1-1 or get the person to the nearest hospital. Otherwise, call your doctor.  Reactions should be reported to the Vaccine Adverse Event Reporting System (VAERS). Your doctor should file this report, or you can do it yourself through the VAERS web site at www.vaers.hhs.gov, or by calling 1-800-822-7967.  VAERS does not give medical advice.  6. The National Vaccine Injury Compensation Program  The National Vaccine Injury Compensation Program (VICP) is a federal program that was created to compensate people who may have been injured by certain vaccines.  Persons who believe they may have been injured by a vaccine can learn about the program and about filing a claim by calling 1-800-338-2382 or visiting the VICP website at www.hrsa.gov/vaccinecompensation. There is a time limit to file a claim for compensation.  7. How can I learn more?  · Ask your healthcare provider. He or she can give you the vaccine package insert or suggest other sources of information.  · Call your local or state health department.  · Contact the  Centers for Disease Control and Prevention (CDC):  ? Call 1-800-232-4636 (1-800-CDC-INFO) or  ? Visit CDC's website at www.cdc.gov/vaccines  Vaccine Information Statement PCV13 Vaccine (05/29/2014)  This information is not intended to replace advice given to you by your health care provider. Make sure you discuss any questions you have with your health care provider.  Document Released: 05/08/2006 Document Revised: 02/20/2018 Document Reviewed: 02/20/2018  Elsevier Interactive Patient Education © 2019 [employer]vier Inc.

## 2018-08-14 NOTE — Progress Notes (Signed)
Date:  08/14/2018   Name:  Sharon Ayers   DOB:  1950/08/08   MRN:  [id number]   Chief Complaint: Hypertension (6 month follow up.); Immunizations (Needs Prevnar13.); and Hyperlipidemia  Hypertension  This is a chronic problem. The problem is controlled. Pertinent negatives include no chest pain, headaches or shortness of breath. Past treatments include angiotensin blockers and diuretics. The current treatment provides significant improvement.  Hyperlipidemia  Pertinent negatives include no chest pain or shortness of breath. Current antihyperlipidemic treatment includes statins. There are no compliance problems.   Osteopenia - last DEXA in 2007 at [facility].  Walking regularly and taking calcium and vitamin d. Lab Results  Component Value Date   CREATININE 0.71 02/08/2018   BUN 16 02/08/2018   NA 139 02/08/2018   K 4.2 02/08/2018   CL 97 02/08/2018   CO2 24 02/08/2018   Lab Results  Component Value Date   CHOL 244 (H) 02/08/2018   HDL 103 02/08/2018   LDLCALC 125 (H) 02/08/2018   TRIG 81 02/08/2018   CHOLHDL 2.4 02/08/2018     Review of Systems  Constitutional: Negative for fatigue and fever.  Respiratory: Negative for cough, chest tightness and shortness of breath.   Cardiovascular: Negative for chest pain and leg swelling.  Gastrointestinal: Negative for abdominal pain, blood in stool, constipation and diarrhea.  Skin: Negative for rash.  Neurological: Negative for dizziness and headaches.  Psychiatric/Behavioral: Negative for dysphoric mood and sleep disturbance.    Patient Active Problem List   Diagnosis Date Noted  . Collagenous colitis 07/25/2017  . Dyslipidemia 05/27/2015  . Essential (primary) hypertension 05/27/2015  . Generalized OA 05/27/2015  . Esophagitis, reflux 05/27/2015  . Calcium blood increased 05/27/2015  . OP (osteoporosis) 05/27/2015  . History of right breast cancer 03/12/2014  . Abnormal Pap smear of cervix 03/26/2013  . Carotid artery  narrowing 11/27/2012    Allergies  Allergen Reactions  . Diclofenac Diarrhea and Nausea And Vomiting  . Ace Inhibitors Cough and Other (See Comments)    Other reaction(s): Cough  . Atorvastatin Other (See Comments)    Severe back pain Severe back pain  . Olmesartan Diarrhea    Past Surgical History:  Procedure Laterality Date  . BILATERAL TOTAL MASTECTOMY WITH AXILLARY LYMPH NODE DISSECTION  05/2014   DCIS right breast ER/PR-  . CAROTID ENDARTERECTOMY  2009  . COLONOSCOPY  2008    Social History   Tobacco Use  . Smoking status: Never Smoker  . Smokeless tobacco: Never Used  Substance Use Topics  . Alcohol use: Yes    Alcohol/week: 2.0 standard drinks    Types: 2 Standard drinks or equivalent per week  . Drug use: Not on file     Medication list has been reviewed and updated.  Current Meds  Medication Sig  . aspirin 325 MG tablet Take 1 tablet by mouth daily.  . hydrochlorothiazide (MICROZIDE) 12.5 MG capsule Take 1 capsule (12.5 mg total) by mouth daily.  . MULTIPLE VITAMIN PO Take by mouth.  . Omega-3 Fatty Acids (FISH OIL BURP-LESS) 1000 MG CAPS Take 1 capsule by mouth daily.  [name] Bicarbonate (ZEGERID OTC) 20-1100 MG CAPS capsule Take 1 capsule by mouth daily.  . pravastatin (PRAVACHOL) 20 MG tablet Take 1 tablet (20 mg total) by mouth daily.  . valsartan (DIOVAN) 320 MG tablet Take 1 tablet (320 mg total) by mouth daily.    PHQ 2/9 Scores 08/14/2018 02/08/2018 02/07/2017 11/23/2015  PHQ - 2 Score  0 0 0 0   Wt Readings from Last 3 Encounters:  08/14/18 142 lb 3.2 oz (64.5 kg)  02/08/18 137 lb 12.8 oz (62.5 kg)  12/11/17 133 lb 3.2 oz (60.4 kg)    Physical Exam Vitals signs and nursing note reviewed.  Constitutional:      General: She is not in acute distress.    Appearance: She is well-developed.  HENT:     Head: Normocephalic and atraumatic.  Eyes:     Pupils: Pupils are equal, round, and reactive to light.  Neck:     Musculoskeletal:  Normal range of motion and neck supple.     Vascular: No carotid bruit.  Cardiovascular:     Rate and Rhythm: Normal rate and regular rhythm.     Pulses: Normal pulses.          Dorsalis pedis pulses are 2+ on the right side and 2+ on the left side.       Posterior tibial pulses are 2+ on the right side and 2+ on the left side.  Pulmonary:     Effort: Pulmonary effort is normal. No respiratory distress.     Breath sounds: Normal breath sounds.  Abdominal:     General: Abdomen is flat. Bowel sounds are normal.     Palpations: Abdomen is soft.     Tenderness: There is no abdominal tenderness.  Musculoskeletal: Normal range of motion.     Right lower leg: No edema.     Left lower leg: No edema.  Lymphadenopathy:     Cervical: No cervical adenopathy.  Skin:    General: Skin is warm and dry.     Findings: No rash.  Neurological:     Mental Status: She is alert and oriented to person, place, and time.  Psychiatric:        Behavior: Behavior normal.        Thought Content: Thought content normal.     BP 108/68   Pulse 95   Ht 5\' 6"  (1.676 m)   Wt 142 lb 3.2 oz (64.5 kg)   SpO2 100%   BMI 22.95 kg/m   Assessment and Plan: 1. Essential (primary) hypertension controlled  2. Dyslipidemia Now on statin therapy - Comprehensive metabolic panel - Lipid panel  3. Ovarian failure Pt will schedule at [facility] - DG Bone Density; Future  4. Need for vaccination for pneumococcus - Pneumococcal conjugate vaccine 13-valent IM   Partially dictated using Editor, commissioning. Any errors are unintentional.  Halina Maidens, MD [facility]  08/14/2018

## 2018-08-15 LAB — COMPREHENSIVE METABOLIC PANEL
ALK PHOS: 83 IU/L (ref 39–117)
ALT: 24 IU/L (ref 0–32)
AST: 22 IU/L (ref 0–40)
Albumin/Globulin Ratio: 2.1 (ref 1.2–2.2)
Albumin: 4.9 g/dL — ABNORMAL HIGH (ref 3.8–4.8)
BUN/Creatinine Ratio: 22 (ref 12–28)
BUN: 17 mg/dL (ref 8–27)
Bilirubin Total: 0.3 mg/dL (ref 0.0–1.2)
CALCIUM: 10 mg/dL (ref 8.7–10.3)
CO2: 24 mmol/L (ref 20–29)
CREATININE: 0.77 mg/dL (ref 0.57–1.00)
Chloride: 99 mmol/L (ref 96–106)
GFR calc Af Amer: 94 mL/min/{1.73_m2} (ref >59)
GFR, EST NON AFRICAN AMERICAN: 81 mL/min/{1.73_m2} (ref >59)
Globulin, Total: 2.3 g/dL (ref 1.5–4.5)
Glucose: 115 mg/dL — ABNORMAL HIGH (ref 65–99)
POTASSIUM: 4.2 mmol/L (ref 3.5–5.2)
Sodium: 139 mmol/L (ref 134–144)
Total Protein: 7.2 g/dL (ref 6.0–8.5)

## 2018-08-15 LAB — LIPID PANEL
CHOL/HDL RATIO: 1.9 ratio (ref 0.0–4.4)
CHOLESTEROL TOTAL: 284 mg/dL — AB (ref 100–199)
HDL: 150 mg/dL (ref >39)
LDL Calculated: 126 mg/dL — ABNORMAL HIGH (ref 0–99)
TRIGLYCERIDES: 40 mg/dL (ref 0–149)
VLDL Cholesterol Cal: 8 mg/dL (ref 5–40)

## 2018-09-10 ENCOUNTER — Inpatient Hospital Stay: Admission: RE | Admit: 2018-09-10 | Payer: Self-pay | Source: Ambulatory Visit

## 2018-12-06 ENCOUNTER — Other Ambulatory Visit: Payer: Self-pay | Admitting: Internal Medicine

## 2018-12-06 DIAGNOSIS — I10 Essential (primary) hypertension: Secondary | ICD-10-CM

## 2018-12-06 DIAGNOSIS — E785 Hyperlipidemia, unspecified: Secondary | ICD-10-CM

## 2019-02-12 ENCOUNTER — Ambulatory Visit (INDEPENDENT_AMBULATORY_CARE_PROVIDER_SITE_OTHER): Payer: Medicare Other | Admitting: Internal Medicine

## 2019-02-12 ENCOUNTER — Other Ambulatory Visit: Payer: Self-pay

## 2019-02-12 ENCOUNTER — Encounter: Payer: Self-pay | Admitting: Internal Medicine

## 2019-02-12 VITALS — BP 138/84 | HR 74 | Ht 66.0 in | Wt 142.0 lb

## 2019-02-12 DIAGNOSIS — K2100 Gastro-esophageal reflux disease with esophagitis, without bleeding: Secondary | ICD-10-CM

## 2019-02-12 DIAGNOSIS — E785 Hyperlipidemia, unspecified: Secondary | ICD-10-CM

## 2019-02-12 DIAGNOSIS — I10 Essential (primary) hypertension: Secondary | ICD-10-CM

## 2019-02-12 DIAGNOSIS — Z0000 Encounter for general adult medical examination without abnormal findings: Secondary | ICD-10-CM

## 2019-02-12 LAB — POCT URINALYSIS DIPSTICK
Bilirubin, UA: NEGATIVE
Blood, UA: NEGATIVE
Glucose, UA: NEGATIVE
Ketones, UA: NEGATIVE
Leukocytes, UA: NEGATIVE
Nitrite, UA: NEGATIVE
Protein, UA: NEGATIVE
Spec Grav, UA: 1.01 (ref 1.010–1.025)
Urobilinogen, UA: 0.2 E.U./dL
pH, UA: 7 (ref 5.0–8.0)

## 2019-02-12 NOTE — Patient Instructions (Signed)

## 2019-02-12 NOTE — Progress Notes (Signed)
Date:  02/12/2019   Name:  Sharon Ayers   DOB:  1950-11-14   MRN:  [id number]   Chief Complaint: Annual Exam (Patient seeing [facility]. No breast exam or pap.) Sharon Ayers is a 68 y.o. female who presents today for her Complete Annual Exam. She feels well. She reports exercising walking daily.. She reports she is sleeping well.   Colonoscopy 2019 Mammograms - no longer needed Pap - aged out Immunizations - needs PPV-23  Hypertension This is a chronic problem. The problem is controlled. Pertinent negatives include no chest pain, headaches, palpitations or shortness of breath. Past treatments include angiotensin blockers and diuretics. The current treatment provides significant improvement.  Hyperlipidemia This is a chronic problem. The problem is controlled. Pertinent negatives include no chest pain or shortness of breath. Current antihyperlipidemic treatment includes statins.  Gastroesophageal Reflux She complains of heartburn. She reports no abdominal pain, no chest pain, no coughing or no wheezing. This is a recurrent problem. The problem occurs rarely. Pertinent negatives include no fatigue. She has tried a PPI for the symptoms.    Review of Systems  Constitutional: Negative for chills, fatigue and fever.  HENT: Negative for congestion, hearing loss, tinnitus, trouble swallowing and voice change.   Eyes: Negative for visual disturbance.  Respiratory: Negative for cough, chest tightness, shortness of breath and wheezing.   Cardiovascular: Negative for chest pain, palpitations and leg swelling.  Gastrointestinal: Positive for heartburn. Negative for abdominal pain, constipation, diarrhea and vomiting.  Endocrine: Negative for polydipsia and polyuria.  Genitourinary: Negative for dysuria, frequency, genital sores, vaginal bleeding and vaginal discharge.  Musculoskeletal: Negative for arthralgias, gait problem and joint swelling.  Skin: Negative for color change and  rash.  Neurological: Negative for dizziness, tremors, light-headedness and headaches.  Hematological: Negative for adenopathy. Does not bruise/bleed easily.  Psychiatric/Behavioral: Negative for dysphoric mood and sleep disturbance. The patient is not nervous/anxious.     Patient Active Problem List   Diagnosis Date Noted  . Collagenous colitis 07/25/2017  . Dyslipidemia 05/27/2015  . Essential (primary) hypertension 05/27/2015  . Generalized OA 05/27/2015  . Esophagitis, reflux 05/27/2015  . Calcium blood increased 05/27/2015  . OP (osteoporosis) 05/27/2015  . History of right breast cancer 03/12/2014  . Abnormal Pap smear of cervix 03/26/2013  . Carotid artery narrowing 11/27/2012    Allergies  Allergen Reactions  . Diclofenac Diarrhea and Nausea And Vomiting  . Ace Inhibitors Cough and Other (See Comments)    Other reaction(s): Cough  . Atorvastatin Other (See Comments)    Severe back pain Severe back pain  . Olmesartan Diarrhea    Past Surgical History:  Procedure Laterality Date  . BILATERAL TOTAL MASTECTOMY WITH AXILLARY LYMPH NODE DISSECTION  05/2014   DCIS right breast ER/PR-  . CAROTID ENDARTERECTOMY  2009  . COLONOSCOPY  2008    Social History   Tobacco Use  . Smoking status: Never Smoker  . Smokeless tobacco: Never Used  Substance Use Topics  . Alcohol use: Yes    Alcohol/week: 2.0 standard drinks    Types: 2 Standard drinks or equivalent per week  . Drug use: Not on file     Medication list has been reviewed and updated.  Current Meds  Medication Sig  . aspirin 325 MG tablet Take 1 tablet by mouth daily.  . hydrochlorothiazide (MICROZIDE) 12.5 MG capsule TAKE 1 CAPSULE BY MOUTH  DAILY  . MULTIPLE VITAMIN PO Take by mouth.  . Omega-3 Fatty Acids (FISH  OIL BURP-LESS) 1000 MG CAPS Take 1 capsule by mouth daily.  [name] Bicarbonate (ZEGERID OTC) 20-1100 MG CAPS capsule Take 1 capsule by mouth daily.  . pravastatin (PRAVACHOL) 20 MG  tablet TAKE 1 TABLET BY MOUTH  DAILY  . valsartan (DIOVAN) 320 MG tablet TAKE 1 TABLET BY MOUTH  DAILY    PHQ 2/9 Scores 02/12/2019 08/14/2018 02/08/2018 02/07/2017  PHQ - 2 Score 0 0 0 0  PHQ- 9 Score 0 - - -    BP Readings from Last 3 Encounters:  02/12/19 138/84  08/14/18 108/68  02/08/18 108/64    Physical Exam Vitals signs and nursing note reviewed.  Constitutional:      General: She is not in acute distress.    Appearance: She is well-developed.  HENT:     Head: Normocephalic and atraumatic.     Right Ear: Tympanic membrane and ear canal normal.     Left Ear: Tympanic membrane and ear canal normal.     Nose:     Right Sinus: No maxillary sinus tenderness.     Left Sinus: No maxillary sinus tenderness.  Eyes:     General: No scleral icterus.       Right eye: No discharge.        Left eye: No discharge.     Conjunctiva/sclera: Conjunctivae normal.  Neck:     Musculoskeletal: Normal range of motion. No erythema.     Thyroid: No thyromegaly.     Vascular: No carotid bruit.  Cardiovascular:     Rate and Rhythm: Normal rate and regular rhythm.     Pulses: Normal pulses.     Heart sounds: Normal heart sounds.  Pulmonary:     Effort: Pulmonary effort is normal. No respiratory distress.     Breath sounds: No wheezing.  Chest:     Breasts:        Right: Absent.        Left: Absent.     Comments: Bilateral breast reconstruction - implants soft, mild scar tissue laterally Abdominal:     General: Bowel sounds are normal.     Palpations: Abdomen is soft.     Tenderness: There is no abdominal tenderness.  Musculoskeletal: Normal range of motion.  Lymphadenopathy:     Cervical: No cervical adenopathy.  Skin:    General: Skin is warm and dry.     Findings: No rash.  Neurological:     Mental Status: She is alert and oriented to person, place, and time.     Cranial Nerves: No cranial nerve deficit.     Sensory: No sensory deficit.     Deep Tendon Reflexes: Reflexes are  normal and symmetric.  Psychiatric:        Attention and Perception: Attention normal.        Mood and Affect: Mood normal.        Speech: Speech normal.        Behavior: Behavior normal.        Thought Content: Thought content normal.     Wt Readings from Last 3 Encounters:  02/12/19 142 lb (64.4 kg)  08/14/18 142 lb 3.2 oz (64.5 kg)  02/08/18 137 lb 12.8 oz (62.5 kg)    BP 138/84   Pulse 74   Ht 5\' 6"  (1.676 m)   Wt 142 lb (64.4 kg)   SpO2 100%   BMI 22.92 kg/m   Assessment and Plan: 1. Annual physical exam Normal exam Continue healthy diet, exericse PPV-23 next  year - POCT urinalysis dipstick  2. Essential (primary) hypertension controlled - CBC with Differential/Platelet - Comprehensive metabolic panel - TSH  3. Dyslipidemia On statin therapy - Lipid panel  4. Esophagitis, reflux Trial off of PPI    Partially dictated using Editor, commissioning. Any errors are unintentional.  Halina Maidens, MD [facility]  02/12/2019

## 2019-02-13 LAB — COMPREHENSIVE METABOLIC PANEL
ALT: 19 IU/L (ref 0–32)
AST: 20 IU/L (ref 0–40)
Albumin/Globulin Ratio: 1.9 (ref 1.2–2.2)
Albumin: 4.8 g/dL (ref 3.8–4.8)
Alkaline Phosphatase: 76 IU/L (ref 39–117)
BUN/Creatinine Ratio: 23 (ref 12–28)
BUN: 19 mg/dL (ref 8–27)
Bilirubin Total: 0.4 mg/dL (ref 0.0–1.2)
CO2: 21 mmol/L (ref 20–29)
Calcium: 9.9 mg/dL (ref 8.7–10.3)
Chloride: 99 mmol/L (ref 96–106)
Creatinine, Ser: 0.81 mg/dL (ref 0.57–1.00)
GFR calc Af Amer: 88 mL/min/{1.73_m2} (ref >59)
GFR calc non Af Amer: 76 mL/min/{1.73_m2} (ref >59)
Globulin, Total: 2.5 g/dL (ref 1.5–4.5)
Glucose: 96 mg/dL (ref 65–99)
Potassium: 4.5 mmol/L (ref 3.5–5.2)
Sodium: 139 mmol/L (ref 134–144)
Total Protein: 7.3 g/dL (ref 6.0–8.5)

## 2019-02-13 LAB — CBC WITH DIFFERENTIAL/PLATELET
Basophils Absolute: 0 10*3/uL (ref 0.0–0.2)
Basos: 1 %
EOS (ABSOLUTE): 0.2 10*3/uL (ref 0.0–0.4)
Eos: 3 %
Hematocrit: 42.1 % (ref 34.0–46.6)
Hemoglobin: 13.8 g/dL (ref 11.1–15.9)
Immature Grans (Abs): 0 10*3/uL (ref 0.0–0.1)
Immature Granulocytes: 0 %
Lymphocytes Absolute: 2.2 10*3/uL (ref 0.7–3.1)
Lymphs: 34 %
MCH: 30.9 pg (ref 26.6–33.0)
MCHC: 32.8 g/dL (ref 31.5–35.7)
MCV: 94 fL (ref 79–97)
Monocytes Absolute: 0.6 10*3/uL (ref 0.1–0.9)
Monocytes: 9 %
Neutrophils Absolute: 3.3 10*3/uL (ref 1.4–7.0)
Neutrophils: 53 %
Platelets: 240 10*3/uL (ref 150–450)
RBC: 4.46 x10E6/uL (ref 3.77–5.28)
RDW: 12.2 % (ref 11.7–15.4)
WBC: 6.3 10*3/uL (ref 3.4–10.8)

## 2019-02-13 LAB — LIPID PANEL
Chol/HDL Ratio: 1.9 ratio (ref 0.0–4.4)
Cholesterol, Total: 299 mg/dL — ABNORMAL HIGH (ref 100–199)
HDL: 157 mg/dL (ref >39)
LDL Calculated: 131 mg/dL — ABNORMAL HIGH (ref 0–99)
Triglycerides: 57 mg/dL (ref 0–149)
VLDL Cholesterol Cal: 11 mg/dL (ref 5–40)

## 2019-02-13 LAB — TSH: TSH: 1.44 u[IU]/mL (ref 0.450–4.500)

## 2019-03-22 ENCOUNTER — Encounter: Payer: Self-pay | Admitting: Internal Medicine

## 2019-03-22 ENCOUNTER — Other Ambulatory Visit: Payer: Self-pay

## 2019-03-22 ENCOUNTER — Ambulatory Visit: Payer: Medicare Other | Admitting: Internal Medicine

## 2019-03-22 VITALS — BP 174/92 | HR 92 | Ht 66.0 in | Wt 131.0 lb

## 2019-03-22 DIAGNOSIS — I10 Essential (primary) hypertension: Secondary | ICD-10-CM

## 2019-03-22 NOTE — Patient Instructions (Signed)
Start Bystolic 5 mg once a day.  Continue to take the Valsartan and HCTZ.

## 2019-03-22 NOTE — Progress Notes (Signed)
Date:  03/22/2019   Name:  Sharon Ayers   DOB:  1950/08/22   MRN:  [id number]   Chief Complaint: Hypertension  Hypertension This is a chronic problem. The problem has been gradually worsening since onset. The problem is uncontrolled. Pertinent negatives include no chest pain, headaches, palpitations or shortness of breath. Past treatments include angiotensin blockers and diuretics. The current treatment provides moderate improvement. There are no compliance problems.  There is no history of kidney disease, CAD/MI or PVD.    Review of Systems  Constitutional: Negative for chills, fatigue and fever.  Eyes: Negative for visual disturbance.  Respiratory: Negative for chest tightness, shortness of breath and wheezing.   Cardiovascular: Negative for chest pain, palpitations and leg swelling.  Neurological: Negative for dizziness, syncope, weakness, light-headedness and headaches.  Psychiatric/Behavioral: Negative for dysphoric mood and sleep disturbance.    Patient Active Problem List   Diagnosis Date Noted  . Collagenous colitis 07/25/2017  . Dyslipidemia 05/27/2015  . Essential (primary) hypertension 05/27/2015  . Generalized OA 05/27/2015  . Esophagitis, reflux 05/27/2015  . Calcium blood increased 05/27/2015  . OP (osteoporosis) 05/27/2015  . History of right breast cancer 03/12/2014  . Abnormal Pap smear of cervix 03/26/2013  . Carotid artery narrowing 11/27/2012    Allergies  Allergen Reactions  . Diclofenac Diarrhea and Nausea And Vomiting  . Ace Inhibitors Cough and Other (See Comments)    Other reaction(s): Cough  . Atorvastatin Other (See Comments)    Severe back pain Severe back pain  . Olmesartan Diarrhea    Past Surgical History:  Procedure Laterality Date  . BILATERAL TOTAL MASTECTOMY WITH AXILLARY LYMPH NODE DISSECTION  05/2014   DCIS right breast ER/PR-  . CAROTID ENDARTERECTOMY  2009  . COLONOSCOPY  2008    Social History   Tobacco Use  .  Smoking status: Never Smoker  . Smokeless tobacco: Never Used  Substance Use Topics  . Alcohol use: Yes    Alcohol/week: 2.0 standard drinks    Types: 2 Standard drinks or equivalent per week  . Drug use: Not on file     Medication list has been reviewed and updated.  Current Meds  Medication Sig  . aspirin 325 MG tablet Take 1 tablet by mouth daily.  . hydrochlorothiazide (MICROZIDE) 12.5 MG capsule TAKE 1 CAPSULE BY MOUTH  DAILY  . MULTIPLE VITAMIN PO Take by mouth.  . Omega-3 Fatty Acids (FISH OIL BURP-LESS) 1000 MG CAPS Take 1 capsule by mouth daily.  [name] Bicarbonate (ZEGERID OTC) 20-1100 MG CAPS capsule Take 1 capsule by mouth daily.  . pravastatin (PRAVACHOL) 20 MG tablet TAKE 1 TABLET BY MOUTH  DAILY  . valsartan (DIOVAN) 320 MG tablet TAKE 1 TABLET BY MOUTH  DAILY    PHQ 2/9 Scores 03/22/2019 02/12/2019 08/14/2018 02/08/2018  PHQ - 2 Score 0 0 0 0  PHQ- 9 Score 0 0 - -    BP Readings from Last 3 Encounters:  03/22/19 (!) 174/92  02/12/19 138/84  08/14/18 108/68    Physical Exam Vitals signs and nursing note reviewed.  Constitutional:      General: She is not in acute distress.    Appearance: She is well-developed.  HENT:     Head: Normocephalic and atraumatic.  Neck:     Musculoskeletal: Normal range of motion.  Cardiovascular:     Rate and Rhythm: Normal rate and regular rhythm.     Pulses: Normal pulses.     Heart sounds:  No murmur.  Pulmonary:     Effort: Pulmonary effort is normal. No respiratory distress.     Breath sounds: No wheezing or rhonchi.  Musculoskeletal:     Right lower leg: No edema.     Left lower leg: No edema.  Lymphadenopathy:     Cervical: No cervical adenopathy.  Skin:    General: Skin is warm and dry.     Capillary Refill: Capillary refill takes less than 2 seconds.     Findings: No rash.  Neurological:     General: No focal deficit present.     Mental Status: She is alert and oriented to person, place, and time.   Psychiatric:        Behavior: Behavior normal.        Thought Content: Thought content normal.     Wt Readings from Last 3 Encounters:  03/22/19 131 lb (59.4 kg)  02/12/19 142 lb (64.4 kg)  08/14/18 142 lb 3.2 oz (64.5 kg)    BP (!) 174/92   Pulse 92   Ht 5\' 6"  (1.676 m)   Wt 131 lb (59.4 kg)   SpO2 98%   BMI 21.14 kg/m   Assessment and Plan: 1. Essential (primary) hypertension Clinically stable exam with uncontrolled BP, worsening over the past few months Tolerating medications, valsartan and hctz, without side effects at this time. Will add Bystolic 5 mg daily (samples) Follow up in 4 weeks Pt to also continue current regimen and low sodium diet; benefits of regular exercise as able discussed.   Partially dictated using Editor, commissioning. Any errors are unintentional.  Halina Maidens, MD [facility]  03/22/2019

## 2019-03-26 ENCOUNTER — Telehealth: Payer: Self-pay

## 2019-03-26 NOTE — Telephone Encounter (Signed)
Patient says she is Dizzy and BP is all over place. Started new med week ago and takes BP 5 times day and it is 120-149/67-90. Advised hlf Bystolic x 1 wk. If still dizzy call after that.

## 2019-04-22 ENCOUNTER — Ambulatory Visit (INDEPENDENT_AMBULATORY_CARE_PROVIDER_SITE_OTHER): Payer: Medicare Other | Admitting: Internal Medicine

## 2019-04-22 ENCOUNTER — Other Ambulatory Visit: Payer: Self-pay

## 2019-04-22 ENCOUNTER — Encounter: Payer: Self-pay | Admitting: Internal Medicine

## 2019-04-22 VITALS — BP 124/80 | HR 66 | Ht 66.0 in | Wt 140.0 lb

## 2019-04-22 DIAGNOSIS — Z23 Encounter for immunization: Secondary | ICD-10-CM

## 2019-04-22 DIAGNOSIS — I10 Essential (primary) hypertension: Secondary | ICD-10-CM

## 2019-04-22 MED ORDER — NEBIVOLOL HCL 5 MG PO TABS: 5.0000 mg | ORAL_TABLET | Freq: Every day | ORAL | 1 refills | Status: DC

## 2019-04-22 NOTE — Progress Notes (Signed)
Date:  04/22/2019   Name:  Sharon Ayers   DOB:  [birth year]   MRN:  [id number]   Chief Complaint: Hypertension (1 month follow up. High dose flu shot.)  Hypertension This is a chronic problem. The problem has been rapidly improving since onset. The problem is controlled. Pertinent negatives include no chest pain, headaches, palpitations or shortness of breath.  Bystolic was added last visit - she is tolerating it well and BP are much improved.  At home 120/80 range.  Initially she had some dizziness but that has resolved.  Review of Systems  Constitutional: Negative for diaphoresis, fatigue and fever.  Respiratory: Negative for chest tightness, shortness of breath and wheezing.   Cardiovascular: Negative for chest pain, palpitations and leg swelling.  Neurological: Negative for dizziness, light-headedness and headaches.    Patient Active Problem List   Diagnosis Date Noted  . Collagenous colitis 07/25/2017  . Dyslipidemia 05/27/2015  . Essential (primary) hypertension 05/27/2015  . Generalized OA 05/27/2015  . Esophagitis, reflux 05/27/2015  . Calcium blood increased 05/27/2015  . OP (osteoporosis) 05/27/2015  . History of right breast cancer 03/12/2014  . Abnormal Pap smear of cervix 03/26/2013  . Carotid artery narrowing 11/27/2012    Allergies  Allergen Reactions  . Diclofenac Diarrhea and Nausea And Vomiting  . Ace Inhibitors Cough and Other (See Comments)    Other reaction(s): Cough  . Atorvastatin Other (See Comments)    Severe back pain Severe back pain  . Olmesartan Diarrhea    Past Surgical History:  Procedure Laterality Date  . BILATERAL TOTAL MASTECTOMY WITH AXILLARY LYMPH NODE DISSECTION  05/2014   DCIS right breast ER/PR-  . CAROTID ENDARTERECTOMY  2009  . COLONOSCOPY  2008    Social History   Tobacco Use  . Smoking status: Never Smoker  . Smokeless tobacco: Never Used  Substance Use Topics  . Alcohol use: Yes    Alcohol/week: 2.0  standard drinks    Types: 2 Standard drinks or equivalent per week  . Drug use: Not on file     Medication list has been reviewed and updated.  Current Meds  Medication Sig  . aspirin 325 MG tablet Take 1 tablet by mouth daily.  . hydrochlorothiazide (MICROZIDE) 12.5 MG capsule TAKE 1 CAPSULE BY MOUTH  DAILY  . MULTIPLE VITAMIN PO Take by mouth.  . nebivolol (BYSTOLIC) 5 MG tablet Take 5 mg by mouth daily.  . Omega-3 Fatty Acids (FISH OIL BURP-LESS) 1000 MG CAPS Take 1 capsule by mouth daily.  [name] Bicarbonate (ZEGERID OTC) 20-1100 MG CAPS capsule Take 1 capsule by mouth daily.  . pravastatin (PRAVACHOL) 20 MG tablet TAKE 1 TABLET BY MOUTH  DAILY  . valsartan (DIOVAN) 320 MG tablet TAKE 1 TABLET BY MOUTH  DAILY    PHQ 2/9 Scores 04/22/2019 03/22/2019 02/12/2019 08/14/2018  PHQ - 2 Score 0 0 0 0  PHQ- 9 Score 0 0 0 -    BP Readings from Last 3 Encounters:  04/22/19 124/80  03/22/19 (!) 174/92  02/12/19 138/84    Physical Exam Vitals signs and nursing note reviewed.  Constitutional:      General: She is not in acute distress.    Appearance: She is well-developed.  HENT:     Head: Normocephalic and atraumatic.  Cardiovascular:     Rate and Rhythm: Normal rate and regular rhythm.     Pulses: Normal pulses.  Pulmonary:     Effort: Pulmonary effort is normal. No  respiratory distress.     Breath sounds: Normal breath sounds. No rhonchi.  Musculoskeletal: Normal range of motion.     Right lower leg: No edema.     Left lower leg: No edema.  Skin:    General: Skin is warm and dry.     Capillary Refill: Capillary refill takes less than 2 seconds.     Findings: No rash.  Neurological:     General: No focal deficit present.     Mental Status: She is alert and oriented to person, place, and time.  Psychiatric:        Behavior: Behavior normal.        Thought Content: Thought content normal.     Wt Readings from Last 3 Encounters:  04/22/19 140 lb (63.5 kg)   03/22/19 131 lb (59.4 kg)  02/12/19 142 lb (64.4 kg)    BP 124/80   Pulse 66   Ht 5\' 6"  (1.676 m)   Wt 140 lb (63.5 kg)   SpO2 97%   BMI 22.60 kg/m   Assessment and Plan: 1. Essential (primary) hypertension Clinically stable exam with well controlled BP.   Tolerating medications, recently added bystolic 5mg  along with valsartan and hctz, without side effects at this time. Pt to continue current regimen and low sodium diet; benefits of regular exercise as able discussed. - nebivolol (BYSTOLIC) 5 MG tablet; Take 1 tablet (5 mg total) by mouth daily.  Dispense: 90 tablet; Refill: 1   Partially dictated using Editor, commissioning. Any errors are unintentional.  Halina Maidens, MD [facility]  04/22/2019

## 2019-05-24 ENCOUNTER — Other Ambulatory Visit: Payer: Self-pay | Admitting: Internal Medicine

## 2019-05-24 DIAGNOSIS — E785 Hyperlipidemia, unspecified: Secondary | ICD-10-CM

## 2019-05-24 DIAGNOSIS — I10 Essential (primary) hypertension: Secondary | ICD-10-CM

## 2019-07-29 ENCOUNTER — Other Ambulatory Visit: Payer: Self-pay

## 2019-07-29 ENCOUNTER — Telehealth: Payer: Self-pay

## 2019-07-29 ENCOUNTER — Ambulatory Visit: Payer: Medicare PPO | Admitting: Internal Medicine

## 2019-07-29 ENCOUNTER — Encounter: Payer: Self-pay | Admitting: Internal Medicine

## 2019-07-29 VITALS — BP 130/74 | HR 80 | Ht 66.0 in | Wt 143.0 lb

## 2019-07-29 DIAGNOSIS — H6690 Otitis media, unspecified, unspecified ear: Secondary | ICD-10-CM | POA: Diagnosis not present

## 2019-07-29 DIAGNOSIS — H6123 Impacted cerumen, bilateral: Secondary | ICD-10-CM

## 2019-07-29 DIAGNOSIS — I10 Essential (primary) hypertension: Secondary | ICD-10-CM | POA: Diagnosis not present

## 2019-07-29 DIAGNOSIS — E785 Hyperlipidemia, unspecified: Secondary | ICD-10-CM | POA: Diagnosis not present

## 2019-07-29 MED ORDER — HYDROCHLOROTHIAZIDE 12.5 MG PO CAPS: 12.5000 mg | ORAL_CAPSULE | Freq: Every day | ORAL | 3 refills | Status: DC

## 2019-07-29 MED ORDER — NEBIVOLOL HCL 5 MG PO TABS: 5.0000 mg | ORAL_TABLET | Freq: Every day | ORAL | 1 refills | Status: DC

## 2019-07-29 MED ORDER — AMOXICILLIN-POT CLAVULANATE 875-125 MG PO TABS: 1.0000 | ORAL_TABLET | Freq: Two times a day (BID) | ORAL | 0 refills | Status: AC

## 2019-07-29 MED ORDER — PRAVASTATIN SODIUM 20 MG PO TABS: 20.0000 mg | ORAL_TABLET | Freq: Every day | ORAL | 3 refills | Status: DC

## 2019-07-29 MED ORDER — VALSARTAN 320 MG PO TABS: 320.0000 mg | ORAL_TABLET | Freq: Every day | ORAL | 3 refills | Status: DC

## 2019-07-29 NOTE — Progress Notes (Signed)
Date:  07/29/2019   Name:  Sharon Ayers   DOB:  Mar 08, 1951   MRN:  [id number]   Chief Complaint: Ear Fullness (Left ear feels clogged for 10 days now. Drainage in back of throat. Both ears are clogged but left is worse. )  Ear Fullness  There is pain in the left ear. This is a new problem. The current episode started 1 to 4 weeks ago. The problem occurs constantly. The problem has been unchanged. Associated symptoms include a sore throat. Pertinent negatives include no abdominal pain, coughing, diarrhea, ear discharge, headaches, rash or vomiting.  Hypertension This is a chronic problem. The problem has been gradually improving since onset. The problem is controlled. Pertinent negatives include no chest pain, headaches or palpitations. Past treatments include diuretics, angiotensin blockers and beta blockers (Bystolic added last visit). The current treatment provides significant improvement. There are no compliance problems.     Lab Results  Component Value Date   CREATININE 0.81 02/12/2019   BUN 19 02/12/2019   NA 139 02/12/2019   K 4.5 02/12/2019   CL 99 02/12/2019   CO2 21 02/12/2019   Lab Results  Component Value Date   CHOL 299 (H) 02/12/2019   HDL 157 02/12/2019   LDLCALC 131 (H) 02/12/2019   TRIG 57 02/12/2019   CHOLHDL 1.9 02/12/2019   Lab Results  Component Value Date   TSH 1.440 02/12/2019   No results found for: HGBA1C   Review of Systems  Constitutional: Negative for chills, fatigue and fever.  HENT: Positive for congestion, ear pain, sinus pressure and sore throat. Negative for ear discharge.   Respiratory: Negative for cough and wheezing.   Cardiovascular: Negative for chest pain and palpitations.  Gastrointestinal: Negative for abdominal pain, diarrhea, nausea and vomiting.  Skin: Negative for rash.  Neurological: Negative for dizziness, light-headedness and headaches.    Patient Active Problem List   Diagnosis Date Noted  . Collagenous colitis  07/25/2017  . Dyslipidemia 05/27/2015  . Essential (primary) hypertension 05/27/2015  . Generalized OA 05/27/2015  . Esophagitis, reflux 05/27/2015  . Calcium blood increased 05/27/2015  . OP (osteoporosis) 05/27/2015  . History of right breast cancer 03/12/2014  . Abnormal Pap smear of cervix 03/26/2013  . Carotid artery narrowing 11/27/2012    Allergies  Allergen Reactions  . Diclofenac Diarrhea and Nausea And Vomiting  . Ace Inhibitors Cough and Other (See Comments)    Other reaction(s): Cough  . Atorvastatin Other (See Comments)    Severe back pain Severe back pain  . Olmesartan Diarrhea    Past Surgical History:  Procedure Laterality Date  . BILATERAL TOTAL MASTECTOMY WITH AXILLARY LYMPH NODE DISSECTION  05/2014   DCIS right breast ER/PR-  . CAROTID ENDARTERECTOMY  2009  . COLONOSCOPY  2008    Social History   Tobacco Use  . Smoking status: Never Smoker  . Smokeless tobacco: Never Used  Substance Use Topics  . Alcohol use: Yes    Alcohol/week: 2.0 standard drinks    Types: 2 Standard drinks or equivalent per week  . Drug use: Not on file     Medication list has been reviewed and updated.  Current Meds  Medication Sig  . aspirin 325 MG tablet Take 1 tablet by mouth daily.  . hydrochlorothiazide (MICROZIDE) 12.5 MG capsule TAKE 1 CAPSULE BY MOUTH  DAILY  . MULTIPLE VITAMIN PO Take by mouth.  . nebivolol (BYSTOLIC) 5 MG tablet Take 1 tablet (5 mg total) by mouth  daily.  . Omega-3 Fatty Acids (FISH OIL BURP-LESS) 1000 MG CAPS Take 1 capsule by mouth daily.  [name] Bicarbonate (ZEGERID OTC) 20-1100 MG CAPS capsule Take 1 capsule by mouth daily.  . pravastatin (PRAVACHOL) 20 MG tablet TAKE 1 TABLET BY MOUTH  DAILY  . valsartan (DIOVAN) 320 MG tablet TAKE 1 TABLET BY MOUTH  DAILY    PHQ 2/9 Scores 07/29/2019 04/22/2019 03/22/2019 02/12/2019  PHQ - 2 Score 0 0 0 0  PHQ- 9 Score - 0 0 0    BP Readings from Last 3 Encounters:  07/29/19 130/74    04/22/19 124/80  03/22/19 (!) 174/92    Physical Exam Vitals and nursing note reviewed.  Constitutional:      General: She is not in acute distress.    Appearance: She is well-developed.  HENT:     Head: Normocephalic and atraumatic.     Right Ear: Hearing normal. There is impacted cerumen.     Left Ear: Hearing normal. There is impacted cerumen.     Ears:     Comments: Impacted cerumen in both ear canals on exam.  Attempted to flush with tepid water with no results. Used a curette to remove a moderate amount of wax from both canals.  Pt tolerated the procedure well. After cleaning, both TM's appear scarred and the right TM is erythematous.   Cardiovascular:     Rate and Rhythm: Normal rate and regular rhythm.     Pulses: Normal pulses.     Heart sounds: No murmur.  Pulmonary:     Effort: Pulmonary effort is normal. No respiratory distress.     Breath sounds: No wheezing or rhonchi.  Musculoskeletal:        General: Normal range of motion.     Cervical back: Normal range of motion and neck supple.  Lymphadenopathy:     Cervical: No cervical adenopathy.  Skin:    General: Skin is warm and dry.     Findings: No rash.  Neurological:     Mental Status: She is alert and oriented to person, place, and time.  Psychiatric:        Attention and Perception: Attention normal.        Mood and Affect: Mood normal.        Behavior: Behavior normal.        Thought Content: Thought content normal.     Wt Readings from Last 3 Encounters:  07/29/19 143 lb (64.9 kg)  04/22/19 140 lb (63.5 kg)  03/22/19 131 lb (59.4 kg)    BP 130/74   Pulse 80   Ht 5\' 6"  (1.676 m)   Wt 143 lb (64.9 kg)   SpO2 98%   BMI 23.08 kg/m   Assessment and Plan: 1. Acute otitis media, unspecified otitis media type Take tylenol or Advil as needed for ear pain Take all antibiotics unless there are side effects - amoxicillin-clavulanate (AUGMENTIN) 875-125 MG tablet; Take 1 tablet by mouth 2 (two) times  daily for 10 days.  Dispense: 20 tablet; Refill: 0  2. Impacted cerumen of both ears Both ears cleaned; tolerated procedure well Recommend H2O2 - 1 drop in each ear once a week  3. Essential (primary) hypertension Clinically stable exam with well controlled BP since adding Bystolic 5 mg Tolerating medications well without side effects at this time. Pt to continue current regimen and low sodium diet; benefits of regular exercise as able discussed. - nebivolol (BYSTOLIC) 5 MG tablet; Take 1 tablet (5  mg total) by mouth daily.  Dispense: 90 tablet; Refill: 1 - hydrochlorothiazide (MICROZIDE) 12.5 MG capsule; Take 1 capsule (12.5 mg total) by mouth daily.  Dispense: 90 capsule; Refill: 3 - valsartan (DIOVAN) 320 MG tablet; Take 1 tablet (320 mg total) by mouth daily.  Dispense: 90 tablet; Refill: 3  4. Dyslipidemia Controlled lipids on statin therapy No myalgia or other side effects - pravastatin (PRAVACHOL) 20 MG tablet; Take 1 tablet (20 mg total) by mouth daily.  Dispense: 90 tablet; Refill: 3   Partially dictated using Editor, commissioning. Any errors are unintentional.  Halina Maidens, MD [facility]  07/29/2019

## 2019-07-29 NOTE — Telephone Encounter (Signed)
Please call pt to see if she would like to come in today at 3:40PM for her left ear stopped up. She has not fever or cough.   Thank you.  CB# [phone]

## 2019-08-01 ENCOUNTER — Telehealth: Payer: Self-pay

## 2019-08-01 NOTE — Telephone Encounter (Signed)
Patient called saying after her visit the other day her ears feel clogged back up on both sides and she cannot hear. Wants advise on what to do. She is taking augmentin tabs split in half 4 times daily to tolerate better for her stomach. Dr. Army Melia said this was ok.   Spoke with Army Melia and informed patient to take coricidin and Flonase and finish abx. Call back if symptoms do not improve after finishing abx.

## 2019-08-05 ENCOUNTER — Ambulatory Visit: Payer: Medicare Other | Admitting: Internal Medicine

## 2019-08-15 DIAGNOSIS — H6980 Other specified disorders of Eustachian tube, unspecified ear: Secondary | ICD-10-CM | POA: Diagnosis not present

## 2019-08-15 DIAGNOSIS — H6523 Chronic serous otitis media, bilateral: Secondary | ICD-10-CM | POA: Diagnosis not present

## 2019-08-27 ENCOUNTER — Ambulatory Visit: Payer: Medicare PPO

## 2019-09-03 DIAGNOSIS — H903 Sensorineural hearing loss, bilateral: Secondary | ICD-10-CM | POA: Diagnosis not present

## 2019-09-03 DIAGNOSIS — H6980 Other specified disorders of Eustachian tube, unspecified ear: Secondary | ICD-10-CM | POA: Diagnosis not present

## 2019-11-25 ENCOUNTER — Ambulatory Visit (INDEPENDENT_AMBULATORY_CARE_PROVIDER_SITE_OTHER): Payer: Medicare PPO

## 2019-11-25 ENCOUNTER — Other Ambulatory Visit: Payer: Self-pay

## 2019-11-25 DIAGNOSIS — Z23 Encounter for immunization: Secondary | ICD-10-CM

## 2020-01-02 ENCOUNTER — Other Ambulatory Visit: Payer: Self-pay | Admitting: Internal Medicine

## 2020-01-02 DIAGNOSIS — I10 Essential (primary) hypertension: Secondary | ICD-10-CM

## 2020-01-23 ENCOUNTER — Other Ambulatory Visit: Payer: Self-pay | Admitting: Internal Medicine

## 2020-01-24 ENCOUNTER — Encounter: Payer: Self-pay | Admitting: Internal Medicine

## 2020-01-24 ENCOUNTER — Ambulatory Visit (INDEPENDENT_AMBULATORY_CARE_PROVIDER_SITE_OTHER): Payer: Medicare PPO | Admitting: Internal Medicine

## 2020-01-24 ENCOUNTER — Other Ambulatory Visit: Payer: Self-pay

## 2020-01-24 VITALS — BP 124/70 | HR 68 | Temp 97.9°F | Ht 66.0 in | Wt 143.0 lb

## 2020-01-24 DIAGNOSIS — I10 Essential (primary) hypertension: Secondary | ICD-10-CM | POA: Diagnosis not present

## 2020-01-24 DIAGNOSIS — E785 Hyperlipidemia, unspecified: Secondary | ICD-10-CM | POA: Diagnosis not present

## 2020-01-24 DIAGNOSIS — Z0000 Encounter for general adult medical examination without abnormal findings: Secondary | ICD-10-CM | POA: Diagnosis not present

## 2020-01-24 DIAGNOSIS — K52831 Collagenous colitis: Secondary | ICD-10-CM

## 2020-01-24 DIAGNOSIS — Z13820 Encounter for screening for osteoporosis: Secondary | ICD-10-CM | POA: Diagnosis not present

## 2020-01-24 LAB — POCT URINALYSIS DIPSTICK
Bilirubin, UA: NEGATIVE
Blood, UA: NEGATIVE
Glucose, UA: NEGATIVE
Ketones, UA: NEGATIVE
Leukocytes, UA: NEGATIVE
Nitrite, UA: NEGATIVE
Protein, UA: NEGATIVE
Spec Grav, UA: 1.01 (ref 1.010–1.025)
Urobilinogen, UA: 0.2 E.U./dL
pH, UA: 5 (ref 5.0–8.0)

## 2020-01-24 MED ORDER — NEBIVOLOL HCL 5 MG PO TABS: 5.0000 mg | ORAL_TABLET | Freq: Every day | ORAL | 3 refills | Status: DC

## 2020-01-24 NOTE — Progress Notes (Signed)
Date:  01/24/2020   Name:  Sharon Ayers   DOB:  [birth year]   MRN:  [id number]   Chief Complaint: Annual Exam (Needs DEXA. No breast exam.)  Sharon Ayers is a 69 y.o. female who presents today for her Complete Annual Exam. She feels well. She reports exercising - 5 days a week she walks 3 miles a day. She reports she is sleeping well. Breast complaints - none.  Mammogram: discontinued Pap smear:  05/2018 Colonoscopy:  02/2018 Immunization History  Administered Date(s) Administered  . Fluad Quad(high Dose 65+) 04/22/2019  . Influenza,inj,Quad PF,6+ Mos 06/02/2017, 05/26/2018  . Moderna SARS-COVID-2 Vaccination 08/20/2019, 09/17/2019  . Pneumococcal Conjugate-13 08/14/2018  . Pneumococcal Polysaccharide-23 11/25/2019  . Tdap 11/01/2012  . Zoster Recombinat (Shingrix) 07/08/2017, 11/06/2017    Hypertension This is a chronic problem. The problem is controlled (at home 120/60). Pertinent negatives include no chest pain, headaches, palpitations or shortness of breath. Past treatments include beta blockers, angiotensin blockers and diuretics. The current treatment provides significant improvement.  Hyperlipidemia This is a chronic problem. The problem is controlled. Pertinent negatives include no chest pain or shortness of breath. Current antihyperlipidemic treatment includes statins. The current treatment provides significant improvement of lipids.    Lab Results  Component Value Date   CREATININE 0.81 02/12/2019   BUN 19 02/12/2019   NA 139 02/12/2019   K 4.5 02/12/2019   CL 99 02/12/2019   CO2 21 02/12/2019   Lab Results  Component Value Date   CHOL 299 (H) 02/12/2019   HDL 157 02/12/2019   LDLCALC 131 (H) 02/12/2019   TRIG 57 02/12/2019   CHOLHDL 1.9 02/12/2019   Lab Results  Component Value Date   TSH 1.440 02/12/2019   No results found for: HGBA1C Lab Results  Component Value Date   WBC 6.3 02/12/2019   HGB 13.8 02/12/2019   HCT 42.1 02/12/2019    MCV 94 02/12/2019   PLT 240 02/12/2019   Lab Results  Component Value Date   ALT 19 02/12/2019   AST 20 02/12/2019   ALKPHOS 76 02/12/2019   BILITOT 0.4 02/12/2019     Review of Systems  Constitutional: Negative for chills, fatigue and fever.  HENT: Negative for congestion, hearing loss, tinnitus, trouble swallowing and voice change.   Eyes: Negative for visual disturbance.  Respiratory: Negative for cough, chest tightness, shortness of breath and wheezing.   Cardiovascular: Negative for chest pain, palpitations and leg swelling.  Gastrointestinal: Negative for abdominal pain, constipation, diarrhea and vomiting.  Endocrine: Negative for polydipsia and polyuria.  Genitourinary: Negative for dysuria, frequency, genital sores, vaginal bleeding and vaginal discharge.  Musculoskeletal: Negative for arthralgias, gait problem and joint swelling.  Skin: Negative for color change and rash.  Neurological: Negative for dizziness, tremors, light-headedness and headaches.  Hematological: Negative for adenopathy. Does not bruise/bleed easily.  Psychiatric/Behavioral: Negative for dysphoric mood and sleep disturbance. The patient is not nervous/anxious.     Patient Active Problem List   Diagnosis Date Noted  . Collagenous colitis 07/25/2017  . Dyslipidemia 05/27/2015  . Essential (primary) hypertension 05/27/2015  . Generalized OA 05/27/2015  . Esophagitis, reflux 05/27/2015  . OP (osteoporosis) 05/27/2015  . History of right breast cancer 03/12/2014  . Abnormal Pap smear of cervix 03/26/2013  . Carotid artery narrowing 11/27/2012    Allergies  Allergen Reactions  . Diclofenac Diarrhea and Nausea And Vomiting  . Ace Inhibitors Cough and Other (See Comments)    Other reaction(s): Cough  .  Atorvastatin Other (See Comments)    Severe back pain Severe back pain  . Olmesartan Diarrhea    Past Surgical History:  Procedure Laterality Date  . BILATERAL TOTAL MASTECTOMY WITH AXILLARY  LYMPH NODE DISSECTION  05/2014   DCIS right breast ER/PR-  . CAROTID ENDARTERECTOMY  2009  . COLONOSCOPY  2008    Social History   Tobacco Use  . Smoking status: Never Smoker  . Smokeless tobacco: Never Used  Vaping Use  . Vaping Use: Never used  Substance Use Topics  . Alcohol use: Yes    Alcohol/week: 2.0 standard drinks    Types: 2 Standard drinks or equivalent per week  . Drug use: Not on file     Medication list has been reviewed and updated.  Current Meds  Medication Sig  . aspirin 325 MG tablet Take 1 tablet by mouth daily.  . budesonide (ENTOCORT EC) 3 MG 24 hr capsule Take 3 mg by mouth as needed.   Marland Kitchen BYSTOLIC 5 MG tablet TAKE 1 TABLET (5 MG TOTAL) BY MOUTH DAILY.  . hydrochlorothiazide (MICROZIDE) 12.5 MG capsule Take 1 capsule (12.5 mg total) by mouth daily.  . MULTIPLE VITAMIN PO Take by mouth.  . Omega-3 Fatty Acids (FISH OIL BURP-LESS) 1000 MG CAPS Take 1 capsule by mouth daily.  [name] Bicarbonate (ZEGERID OTC) 20-1100 MG CAPS capsule Take 1 capsule by mouth daily.  . pravastatin (PRAVACHOL) 20 MG tablet Take 1 tablet (20 mg total) by mouth daily.  . valsartan (DIOVAN) 320 MG tablet Take 1 tablet (320 mg total) by mouth daily.    PHQ 2/9 Scores 01/24/2020 07/29/2019 04/22/2019 03/22/2019  PHQ - 2 Score 0 0 0 0  PHQ- 9 Score 0 - 0 0    GAD 7 : Generalized Anxiety Score 01/24/2020  Nervous, Anxious, on Edge 0  Control/stop worrying 0  Worry too much - different things 0  Trouble relaxing 0  Restless 0  Easily annoyed or irritable 0  Afraid - awful might happen 0  Total GAD 7 Score 0  Anxiety Difficulty Not difficult at all    BP Readings from Last 3 Encounters:  01/24/20 124/70  07/29/19 130/74  04/22/19 124/80    Physical Exam Vitals and nursing note reviewed.  Constitutional:      General: She is not in acute distress.    Appearance: She is well-developed.  HENT:     Head: Normocephalic and atraumatic.     Right Ear: Tympanic  membrane and ear canal normal.     Left Ear: Tympanic membrane and ear canal normal.     Nose:     Right Sinus: No maxillary sinus tenderness.     Left Sinus: No maxillary sinus tenderness.  Eyes:     General: No scleral icterus.       Right eye: No discharge.        Left eye: No discharge.     Conjunctiva/sclera: Conjunctivae normal.  Neck:     Thyroid: No thyromegaly.     Vascular: No carotid bruit.  Cardiovascular:     Rate and Rhythm: Normal rate and regular rhythm.     Pulses: Normal pulses.     Heart sounds: Normal heart sounds.  Pulmonary:     Effort: Pulmonary effort is normal. No respiratory distress.     Breath sounds: No wheezing.  Abdominal:     General: Bowel sounds are normal.     Palpations: Abdomen is soft.     Tenderness:  There is no abdominal tenderness.  Musculoskeletal:     Cervical back: Normal range of motion. No erythema.     Right lower leg: No edema.     Left lower leg: No edema.  Lymphadenopathy:     Cervical: No cervical adenopathy.  Skin:    General: Skin is warm and dry.     Findings: No rash.  Neurological:     Mental Status: She is alert and oriented to person, place, and time.     Cranial Nerves: No cranial nerve deficit.     Sensory: No sensory deficit.     Deep Tendon Reflexes: Reflexes are normal and symmetric.  Psychiatric:        Attention and Perception: Attention normal.        Mood and Affect: Mood normal.     Wt Readings from Last 3 Encounters:  01/24/20 143 lb (64.9 kg)  07/29/19 143 lb (64.9 kg)  04/22/19 140 lb (63.5 kg)    BP 124/70 (BP Location: Right Arm, Patient Position: Sitting, Cuff Size: Normal)   Pulse 68   Temp 97.9 F (36.6 C) (Oral)   Ht 5\' 6"  (1.676 m)   Wt 143 lb (64.9 kg)   SpO2 100%   BMI 23.08 kg/m   Assessment and Plan: 1. Annual physical exam Normal exam Continue healthy diet, exercise - POCT urinalysis dipstick  2. Encounter for screening for osteoporosis - DG Bone Density; Future  3.  Essential (primary) hypertension Clinically stable exam with well controlled BP on three medications. Tolerating medications without side effects at this time. Pt to continue current regimen and low sodium diet; benefits of regular exercise as able discussed. - CBC with Differential/Platelet - Comprehensive metabolic panel - TSH - nebivolol (BYSTOLIC) 5 MG tablet; Take 1 tablet (5 mg total) by mouth daily.  Dispense: 90 tablet; Refill: 3  4. Dyslipidemia Tolerating statin medication without side effects at this time Continue same therapy without change at this time. - Lipid panel  5. Collagenous colitis Followed by Dr. Denice Paradise Colonoscopy is up to date and was normal in 2019 Continue to use Budesonide PRN with flares   Partially dictated using Editor, commissioning. Any errors are unintentional.  Halina Maidens, MD [facility]  01/24/2020

## 2020-01-25 LAB — LIPID PANEL
Chol/HDL Ratio: 2 ratio (ref 0.0–4.4)
Cholesterol, Total: 250 mg/dL — ABNORMAL HIGH (ref 100–199)
HDL: 128 mg/dL (ref >39)
LDL Chol Calc (NIH): 114 mg/dL — ABNORMAL HIGH (ref 0–99)
Triglycerides: 52 mg/dL (ref 0–149)
VLDL Cholesterol Cal: 8 mg/dL (ref 5–40)

## 2020-01-25 LAB — CBC WITH DIFFERENTIAL/PLATELET
Basophils Absolute: 0.1 10*3/uL (ref 0.0–0.2)
Basos: 1 %
EOS (ABSOLUTE): 0.5 10*3/uL — ABNORMAL HIGH (ref 0.0–0.4)
Eos: 6 %
Hematocrit: 43.1 % (ref 34.0–46.6)
Hemoglobin: 14.7 g/dL (ref 11.1–15.9)
Immature Grans (Abs): 0 10*3/uL (ref 0.0–0.1)
Immature Granulocytes: 0 %
Lymphocytes Absolute: 2.7 10*3/uL (ref 0.7–3.1)
Lymphs: 32 %
MCH: 32 pg (ref 26.6–33.0)
MCHC: 34.1 g/dL (ref 31.5–35.7)
MCV: 94 fL (ref 79–97)
Monocytes Absolute: 0.6 10*3/uL (ref 0.1–0.9)
Monocytes: 8 %
Neutrophils Absolute: 4.4 10*3/uL (ref 1.4–7.0)
Neutrophils: 53 %
Platelets: 277 10*3/uL (ref 150–450)
RBC: 4.59 x10E6/uL (ref 3.77–5.28)
RDW: 12.2 % (ref 11.7–15.4)
WBC: 8.3 10*3/uL (ref 3.4–10.8)

## 2020-01-25 LAB — COMPREHENSIVE METABOLIC PANEL
ALT: 20 IU/L (ref 0–32)
AST: 18 IU/L (ref 0–40)
Albumin/Globulin Ratio: 2.1 (ref 1.2–2.2)
Albumin: 4.7 g/dL (ref 3.8–4.8)
Alkaline Phosphatase: 77 IU/L (ref 48–121)
BUN/Creatinine Ratio: 24 (ref 12–28)
BUN: 18 mg/dL (ref 8–27)
Bilirubin Total: 0.4 mg/dL (ref 0.0–1.2)
CO2: 23 mmol/L (ref 20–29)
Calcium: 9.9 mg/dL (ref 8.7–10.3)
Chloride: 100 mmol/L (ref 96–106)
Creatinine, Ser: 0.74 mg/dL (ref 0.57–1.00)
GFR calc Af Amer: 98 mL/min/{1.73_m2} (ref >59)
GFR calc non Af Amer: 85 mL/min/{1.73_m2} (ref >59)
Globulin, Total: 2.2 g/dL (ref 1.5–4.5)
Glucose: 88 mg/dL (ref 65–99)
Potassium: 4.5 mmol/L (ref 3.5–5.2)
Sodium: 138 mmol/L (ref 134–144)
Total Protein: 6.9 g/dL (ref 6.0–8.5)

## 2020-01-25 LAB — TSH: TSH: 1.05 u[IU]/mL (ref 0.450–4.500)

## 2020-02-05 ENCOUNTER — Inpatient Hospital Stay: Admission: RE | Admit: 2020-02-05 | Payer: Self-pay | Source: Ambulatory Visit

## 2020-02-24 ENCOUNTER — Other Ambulatory Visit: Payer: Self-pay

## 2020-02-24 ENCOUNTER — Ambulatory Visit
Admission: RE | Admit: 2020-02-24 | Discharge: 2020-02-24 | Disposition: A | Discharge status: Home / Self Care | Payer: Medicare PPO | Source: Ambulatory Visit | Attending: Internal Medicine | Admitting: Internal Medicine

## 2020-02-24 DIAGNOSIS — M8580 Other specified disorders of bone density and structure, unspecified site: Secondary | ICD-10-CM | POA: Diagnosis not present

## 2020-02-24 DIAGNOSIS — Z780 Asymptomatic menopausal state: Secondary | ICD-10-CM | POA: Diagnosis not present

## 2020-02-24 DIAGNOSIS — M8589 Other specified disorders of bone density and structure, multiple sites: Secondary | ICD-10-CM | POA: Diagnosis not present

## 2020-02-24 DIAGNOSIS — Z13820 Encounter for screening for osteoporosis: Secondary | ICD-10-CM | POA: Diagnosis not present

## 2020-02-24 DIAGNOSIS — Z853 Personal history of malignant neoplasm of breast: Secondary | ICD-10-CM | POA: Insufficient documentation

## 2020-06-08 ENCOUNTER — Other Ambulatory Visit: Payer: Self-pay | Admitting: Internal Medicine

## 2020-06-08 DIAGNOSIS — I10 Essential (primary) hypertension: Secondary | ICD-10-CM

## 2020-06-23 ENCOUNTER — Other Ambulatory Visit: Payer: Self-pay | Admitting: Internal Medicine

## 2020-06-23 DIAGNOSIS — E785 Hyperlipidemia, unspecified: Secondary | ICD-10-CM

## 2020-06-23 DIAGNOSIS — I10 Essential (primary) hypertension: Secondary | ICD-10-CM

## 2020-06-30 ENCOUNTER — Telehealth: Payer: Self-pay | Admitting: Internal Medicine

## 2020-06-30 NOTE — Telephone Encounter (Signed)
Called pt scheduled appt for 12/13. Follow up for bp.  KP

## 2020-06-30 NOTE — Telephone Encounter (Signed)
Pt receive a letter from [employer] order that her valsartan and pravastatin was denied. Pt states she only come in once a year for her cpe. Pt is sch for her cpe 01/29/2021. Pt said her bp is averaging 128/60 she checks her bp  about twice a week.

## 2020-07-06 ENCOUNTER — Other Ambulatory Visit: Payer: Self-pay

## 2020-07-06 ENCOUNTER — Ambulatory Visit (INDEPENDENT_AMBULATORY_CARE_PROVIDER_SITE_OTHER): Payer: Medicare PPO | Admitting: Internal Medicine

## 2020-07-06 ENCOUNTER — Encounter: Payer: Self-pay | Admitting: Internal Medicine

## 2020-07-06 VITALS — BP 112/74 | HR 64 | Temp 98.1°F | Ht 66.0 in | Wt 139.0 lb

## 2020-07-06 DIAGNOSIS — I10 Essential (primary) hypertension: Secondary | ICD-10-CM

## 2020-07-06 DIAGNOSIS — E785 Hyperlipidemia, unspecified: Secondary | ICD-10-CM | POA: Diagnosis not present

## 2020-07-06 MED ORDER — VALSARTAN 320 MG PO TABS: 320.0000 mg | ORAL_TABLET | Freq: Every day | ORAL | 3 refills | Status: DC

## 2020-07-06 MED ORDER — PRAVASTATIN SODIUM 20 MG PO TABS: 20.0000 mg | ORAL_TABLET | Freq: Every day | ORAL | 3 refills | Status: DC

## 2020-07-06 NOTE — Progress Notes (Signed)
Date:  07/06/2020   Name:  Sharon Ayers   DOB:  04/20/1951   MRN:  [id number]   Chief Complaint: Hypertension  Hypertension This is a chronic problem. The problem is controlled. Pertinent negatives include no chest pain, headaches, palpitations or shortness of breath. Past treatments include angiotensin blockers, beta blockers and diuretics. The current treatment provides significant improvement.    Lab Results  Component Value Date   CREATININE 0.74 01/24/2020   BUN 18 01/24/2020   NA 138 01/24/2020   K 4.5 01/24/2020   CL 100 01/24/2020   CO2 23 01/24/2020   Lab Results  Component Value Date   CHOL 250 (H) 01/24/2020   HDL 128 01/24/2020   LDLCALC 114 (H) 01/24/2020   TRIG 52 01/24/2020   CHOLHDL 2.0 01/24/2020   Lab Results  Component Value Date   TSH 1.050 01/24/2020   No results found for: HGBA1C Lab Results  Component Value Date   WBC 8.3 01/24/2020   HGB 14.7 01/24/2020   HCT 43.1 01/24/2020   MCV 94 01/24/2020   PLT 277 01/24/2020   Lab Results  Component Value Date   ALT 20 01/24/2020   AST 18 01/24/2020   ALKPHOS 77 01/24/2020   BILITOT 0.4 01/24/2020     Review of Systems  Constitutional: Negative for chills, fatigue, fever and unexpected weight change.  Respiratory: Negative for cough, chest tightness and shortness of breath.   Cardiovascular: Negative for chest pain, palpitations and leg swelling.  Neurological: Negative for dizziness and headaches.    Patient Active Problem List   Diagnosis Date Noted  . Collagenous colitis 07/25/2017  . Dyslipidemia 05/27/2015  . Essential (primary) hypertension 05/27/2015  . Generalized OA 05/27/2015  . Esophagitis, reflux 05/27/2015  . OP (osteoporosis) 05/27/2015  . History of right breast cancer 03/12/2014  . Abnormal Pap smear of cervix 03/26/2013  . Carotid artery narrowing 11/27/2012    Allergies  Allergen Reactions  . Diclofenac Diarrhea and Nausea And Vomiting  . Ace  Inhibitors Cough and Other (See Comments)    Other reaction(s): Cough  . Atorvastatin Other (See Comments)    Severe back pain Severe back pain  . Olmesartan Diarrhea    Past Surgical History:  Procedure Laterality Date  . BILATERAL TOTAL MASTECTOMY WITH AXILLARY LYMPH NODE DISSECTION  05/2014   DCIS right breast ER/PR-  . CAROTID ENDARTERECTOMY  2009  . COLONOSCOPY  2008    Social History   Tobacco Use  . Smoking status: Never Smoker  . Smokeless tobacco: Never Used  Vaping Use  . Vaping Use: Never used  Substance Use Topics  . Alcohol use: Yes    Alcohol/week: 2.0 standard drinks    Types: 2 Standard drinks or equivalent per week     Medication list has been reviewed and updated.  Current Meds  Medication Sig  . aspirin 325 MG tablet Take 1 tablet by mouth daily.  . budesonide (ENTOCORT EC) 3 MG 24 hr capsule Take 3 mg by mouth as needed.   . calcium carbonate (OSCAL) 1500 (600 Ca) MG TABS tablet Take by mouth 2 (two) times daily with a meal.  . ELDERBERRY PO Take by mouth in the morning and at bedtime.  . hydrochlorothiazide (MICROZIDE) 12.5 MG capsule TAKE 1 CAPSULE EVERY DAY  . MULTIPLE VITAMIN PO Take by mouth.  . nebivolol (BYSTOLIC) 5 MG tablet Take 1 tablet (5 mg total) by mouth daily.  . Omega-3 Fatty Acids ([place])  1000 MG CAPS Take 1 capsule by mouth daily.  [name] Bicarbonate (ZEGERID OTC) 20-1100 MG CAPS capsule Take 1 capsule by mouth daily.  . vitamin E (VITAMIN E) 180 MG (400 UNITS) capsule Take 400 Units by mouth daily.  . [DISCONTINUED] pravastatin (PRAVACHOL) 20 MG tablet Take 1 tablet (20 mg total) by mouth daily.  . [DISCONTINUED] valsartan (DIOVAN) 320 MG tablet Take 1 tablet (320 mg total) by mouth daily.    PHQ 2/9 Scores 07/06/2020 01/24/2020 07/29/2019 04/22/2019  PHQ - 2 Score 0 0 0 0  PHQ- 9 Score 0 0 - 0    GAD 7 : Generalized Anxiety Score 07/06/2020 01/24/2020  Nervous, Anxious, on Edge 0 0  Control/stop worrying  0 0  Worry too much - different things 0 0  Trouble relaxing 0 0  Restless 0 0  Easily annoyed or irritable 0 0  Afraid - awful might happen 0 0  Total GAD 7 Score 0 0  Anxiety Difficulty - Not difficult at all    BP Readings from Last 3 Encounters:  07/06/20 112/74  01/24/20 124/70  07/29/19 130/74    Physical Exam Vitals and nursing note reviewed.  Constitutional:      General: She is not in acute distress.    Appearance: She is well-developed.  HENT:     Head: Normocephalic and atraumatic.  Cardiovascular:     Rate and Rhythm: Normal rate and regular rhythm.     Pulses: Normal pulses.  Pulmonary:     Effort: Pulmonary effort is normal. No respiratory distress.     Breath sounds: No wheezing or rhonchi.  Musculoskeletal:        General: Normal range of motion.     Cervical back: Normal range of motion.     Right lower leg: No edema.     Left lower leg: No edema.  Lymphadenopathy:     Cervical: No cervical adenopathy.  Skin:    General: Skin is warm and dry.     Capillary Refill: Capillary refill takes less than 2 seconds.     Findings: No rash.  Neurological:     General: No focal deficit present.     Mental Status: She is alert and oriented to person, place, and time.  Psychiatric:        Mood and Affect: Mood and affect and mood normal.     Wt Readings from Last 3 Encounters:  07/06/20 139 lb (63 kg)  01/24/20 143 lb (64.9 kg)  07/29/19 143 lb (64.9 kg)    BP 112/74   Pulse 64   Temp 98.1 F (36.7 C) (Oral)   Ht 5\' 6"  (1.676 m)   Wt 139 lb (63 kg)   SpO2 99%   BMI 22.44 kg/m   Assessment and Plan: 1. Essential (primary) hypertension Clinically stable exam with well controlled BP on hctz, valsartan and bystolic. Tolerating medications without side effects at this time. Pt to continue current regimen and low sodium diet; benefits of regular exercise as able discussed. - valsartan (DIOVAN) 320 MG tablet; Take 1 tablet (320 mg total) by mouth  daily.  Dispense: 90 tablet; Refill: 3  2. Dyslipidemia Lipids well controlled on statin without side effects - pravastatin (PRAVACHOL) 20 MG tablet; Take 1 tablet (20 mg total) by mouth daily.  Dispense: 90 tablet; Refill: 3   Partially dictated using Editor, commissioning. Any errors are unintentional.  Halina Maidens, MD [facility]  07/06/2020

## 2020-07-15 ENCOUNTER — Ambulatory Visit: Payer: Medicare PPO | Admitting: Internal Medicine

## 2020-11-04 ENCOUNTER — Ambulatory Visit (INDEPENDENT_AMBULATORY_CARE_PROVIDER_SITE_OTHER): Payer: Medicare PPO

## 2020-11-04 VITALS — Ht 66.0 in | Wt 135.0 lb

## 2020-11-04 DIAGNOSIS — Z0000 Encounter for general adult medical examination without abnormal findings: Secondary | ICD-10-CM

## 2020-11-04 NOTE — Patient Instructions (Signed)
Sharon Ayers , Thank you for taking time to come for your Medicare Wellness Visit. I appreciate your ongoing commitment to your health goals. Please review the following plan we discussed and let me know if I can assist you in the future.   Screening recommendations/referrals: Colonoscopy: done 02/2718. Repeat in 2029 Bone Density: done 02/24/20 Recommended yearly ophthalmology/optometry visit for glaucoma screening and checkup Recommended yearly dental visit for hygiene and checkup  Vaccinations: Influenza vaccine: done 05/15/20 Pneumococcal vaccine: done 11/25/19 Tdap vaccine: done 11/01/12 Shingles vaccine: done 07/08/17 & 11/06/17   Covid-19:done 08/20/19, 09/17/19 & 06/12/20  Advanced directives: Please bring a copy of your health care power of attorney and living will to the office at your convenience.  Conditions/risks identified: Keep up the great work!  Next appointment: Follow up in one year for your annual wellness visit    Preventive Care 65 Years and Older, Female Preventive care refers to lifestyle choices and visits with your health care provider that can promote health and wellness. What does preventive care include?  A yearly physical exam. This is also called an annual well check.  Dental exams once or twice a year.  Routine eye exams. Ask your health care provider how often you should have your eyes checked.  Personal lifestyle choices, including:  Daily care of your teeth and gums.  Regular physical activity.  Eating a healthy diet.  Avoiding tobacco and drug use.  Limiting alcohol use.  Practicing safe sex.  Taking low-dose aspirin every day.  Taking vitamin and mineral supplements as recommended by your health care provider. What happens during an annual well check? The services and screenings done by your health care provider during your annual well check will depend on your age, overall health, lifestyle risk factors, and family history of  disease. Counseling  Your health care provider may ask you questions about your:  Alcohol use.  Tobacco use.  Drug use.  Emotional well-being.  Home and relationship well-being.  Sexual activity.  Eating habits.  History of falls.  Memory and ability to understand (cognition).  Work and work Statistician.  Reproductive health. Screening  You may have the following tests or measurements:  Height, weight, and BMI.  Blood pressure.  Lipid and cholesterol levels. These may be checked every 5 years, or more frequently if you are over 97 years old.  Skin check.  Lung cancer screening. You may have this screening every year starting at age 49 if you have a 30-pack-year history of smoking and currently smoke or have quit within the past 15 years.  Fecal occult blood test (FOBT) of the stool. You may have this test every year starting at age 62.  Flexible sigmoidoscopy or colonoscopy. You may have a sigmoidoscopy every 5 years or a colonoscopy every 10 years starting at age 25.  Hepatitis C blood test.  Hepatitis B blood test.  Sexually transmitted disease (STD) testing.  Diabetes screening. This is done by checking your blood sugar (glucose) after you have not eaten for a while (fasting). You may have this done every 1-3 years.  Bone density scan. This is done to screen for osteoporosis. You may have this done starting at age 66.  Mammogram. This may be done every 1-2 years. Talk to your health care provider about how often you should have regular mammograms. Talk with your health care provider about your test results, treatment options, and if necessary, the need for more tests. Vaccines  Your health care provider may recommend  certain vaccines, such as:  Influenza vaccine. This is recommended every year.  Tetanus, diphtheria, and acellular pertussis (Tdap, Td) vaccine. You may need a Td booster every 10 years.  Zoster vaccine. You may need this after age  52.  Pneumococcal 13-valent conjugate (PCV13) vaccine. One dose is recommended after age 28.  Pneumococcal polysaccharide (PPSV23) vaccine. One dose is recommended after age 13. Talk to your health care provider about which screenings and vaccines you need and how often you need them. This information is not intended to replace advice given to you by your health care provider. Make sure you discuss any questions you have with your health care provider. Document Released: 08/07/2015 Document Revised: 03/30/2016 Document Reviewed: 05/12/2015 Elsevier Interactive Patient Education  2017 [place] Prevention in the Home Falls can cause injuries. They can happen to people of all ages. There are many things you can do to make your home safe and to help prevent falls. What can I do on the outside of my home?  Regularly fix the edges of walkways and driveways and fix any cracks.  Remove anything that might make you trip as you walk through a door, such as a raised step or threshold.  Trim any bushes or trees on the path to your home.  Use bright outdoor lighting.  Clear any walking paths of anything that might make someone trip, such as rocks or tools.  Regularly check to see if handrails are loose or broken. Make sure that both sides of any steps have handrails.  Any raised decks and porches should have guardrails on the edges.  Have any leaves, snow, or ice cleared regularly.  Use sand or salt on walking paths during winter.  Clean up any spills in your garage right away. This includes oil or grease spills. What can I do in the bathroom?  Use night lights.  Install grab bars by the toilet and in the tub and shower. Do not use towel bars as grab bars.  Use non-skid mats or decals in the tub or shower.  If you need to sit down in the shower, use a plastic, non-slip stool.  Keep the floor dry. Clean up any water that spills on the floor as soon as it happens.  Remove  soap buildup in the tub or shower regularly.  Attach bath mats securely with double-sided non-slip rug tape.  Do not have throw rugs and other things on the floor that can make you trip. What can I do in the bedroom?  Use night lights.  Make sure that you have a light by your bed that is easy to reach.  Do not use any sheets or blankets that are too big for your bed. They should not hang down onto the floor.  Have a firm chair that has side arms. You can use this for support while you get dressed.  Do not have throw rugs and other things on the floor that can make you trip. What can I do in the kitchen?  Clean up any spills right away.  Avoid walking on wet floors.  Keep items that you use a lot in easy-to-reach places.  If you need to reach something above you, use a strong step stool that has a grab bar.  Keep electrical cords out of the way.  Do not use floor polish or wax that makes floors slippery. If you must use wax, use non-skid floor wax.  Do not have throw rugs and other  things on the floor that can make you trip. What can I do with my stairs?  Do not leave any items on the stairs.  Make sure that there are handrails on both sides of the stairs and use them. Fix handrails that are broken or loose. Make sure that handrails are as long as the stairways.  Check any carpeting to make sure that it is firmly attached to the stairs. Fix any carpet that is loose or worn.  Avoid having throw rugs at the top or bottom of the stairs. If you do have throw rugs, attach them to the floor with carpet tape.  Make sure that you have a light switch at the top of the stairs and the bottom of the stairs. If you do not have them, ask someone to add them for you. What else can I do to help prevent falls?  Wear shoes that:  Do not have high heels.  Have rubber bottoms.  Are comfortable and fit you well.  Are closed at the toe. Do not wear sandals.  If you use a  stepladder:  Make sure that it is fully opened. Do not climb a closed stepladder.  Make sure that both sides of the stepladder are locked into place.  Ask someone to hold it for you, if possible.  Clearly mark and make sure that you can see:  Any grab bars or handrails.  First and last steps.  Where the edge of each step is.  Use tools that help you move around (mobility aids) if they are needed. These include:  Canes.  Walkers.  Scooters.  Crutches.  Turn on the lights when you go into a dark area. Replace any light bulbs as soon as they burn out.  Set up your furniture so you have a clear path. Avoid moving your furniture around.  If any of your floors are uneven, fix them.  If there are any pets around you, be aware of where they are.  Review your medicines with your doctor. Some medicines can make you feel dizzy. This can increase your chance of falling. Ask your doctor what other things that you can do to help prevent falls. This information is not intended to replace advice given to you by your health care provider. Make sure you discuss any questions you have with your health care provider. Document Released: 05/07/2009 Document Revised: 12/17/2015 Document Reviewed: 08/15/2014 Elsevier Interactive Patient Education  2017 [employer].

## 2020-11-04 NOTE — Progress Notes (Signed)
Subjective:   Sharon Ayers is a 70 y.o. female who presents for an Initial Medicare Annual Wellness Visit.  Virtual Visit via Telephone Note  I connected with  Sharon Ayers on 11/04/20 at 11:20 AM EDT by telephone and verified that I am speaking with the correct person using two identifiers.  Location: Patient: home Provider: [facility] Persons participating in the virtual visit: [place]   I discussed the limitations, risks, security and privacy concerns of performing an evaluation and management service by telephone and the availability of in person appointments. The patient expressed understanding and agreed to proceed.  Interactive audio and video telecommunications were attempted between this nurse and patient, however failed, due to patient having technical difficulties OR patient did not have access to video capability.  We continued and completed visit with audio only.  Some vital signs may be absent or patient reported.   Clemetine Marker, LPN    Review of Systems     Cardiac Risk Factors include: advanced age (>79men, >20 women);dyslipidemia;hypertension     Objective:    Today's Vitals   11/04/20 1251  Weight: 135 lb (61.2 kg)  Height: 5\' 6"  (1.676 m)   Body mass index is 21.79 kg/m.  Advanced Directives 11/04/2020 12/02/2015  Does Patient Have a Medical Advance Directive? Yes No  Type of Paramedic of [place];Living will -  Copy of [place] in Chart? No - copy requested -    Current Medications (verified) Outpatient Encounter Medications as of 11/04/2020  Medication Sig  . Ascorbic Acid (VITAMIN C) 1000 MG tablet Take 1,000 mg by mouth daily.  Marland Kitchen aspirin 325 MG tablet Take 1 tablet by mouth daily.  . budesonide (ENTOCORT EC) 3 MG 24 hr capsule Take 3 mg by mouth as needed.   . calcium carbonate (OSCAL) 1500 (600 Ca) MG TABS tablet Take by mouth 2 (two) times daily with a meal.  .  ELDERBERRY PO Take by mouth in the morning and at bedtime.  . hydrochlorothiazide (MICROZIDE) 12.5 MG capsule TAKE 1 CAPSULE EVERY DAY  . MULTIPLE VITAMIN PO Take by mouth.  . nebivolol (BYSTOLIC) 5 MG tablet Take 1 tablet (5 mg total) by mouth daily.  . Omega-3 Fatty Acids (FISH OIL BURP-LESS) 1000 MG CAPS Take 1 capsule by mouth daily.  [name] Bicarbonate (ZEGERID OTC) 20-1100 MG CAPS capsule Take 1 capsule by mouth daily.  . pravastatin (PRAVACHOL) 20 MG tablet Take 1 tablet (20 mg total) by mouth daily.  . valsartan (DIOVAN) 320 MG tablet Take 1 tablet (320 mg total) by mouth daily.  . [DISCONTINUED] vitamin E 180 MG (400 UNITS) capsule Take 400 Units by mouth daily.   No facility-administered encounter medications on file as of 11/04/2020.    Allergies (verified) Diclofenac, Ace inhibitors, Atorvastatin, and Olmesartan   History: Past Medical History:  Diagnosis Date  . GERD (gastroesophageal reflux disease)   . Hypertension    Past Surgical History:  Procedure Laterality Date  . BILATERAL TOTAL MASTECTOMY WITH AXILLARY LYMPH NODE DISSECTION  05/2014   DCIS right breast ER/PR-  . CAROTID ENDARTERECTOMY  2009  . COLONOSCOPY  2008   Family History  Problem Relation Age of Onset  . Hypertension Mother   . Hypertension Father    Social History   Socioeconomic History  . Marital status: Single    Spouse name: Not on file  . Number of children: Not on file  . Years of education: Not  on file  . Highest education level: Not on file  Occupational History  . Not on file  Tobacco Use  . Smoking status: Never Smoker  . Smokeless tobacco: Never Used  Vaping Use  . Vaping Use: Never used  Substance and Sexual Activity  . Alcohol use: Yes    Alcohol/week: 2.0 standard drinks    Types: 2 Standard drinks or equivalent per week  . Drug use: Never  . Sexual activity: Not on file  Other Topics Concern  . Not on file  Social History Narrative   Pt lives alone    Social Determinants of Health   Financial Resource Strain: Low Risk   . Difficulty of Paying Living Expenses: Not hard at all  Food Insecurity: No Food Insecurity  . Worried About Charity fundraiser in the Last Year: Never true  . Ran Out of Food in the Last Year: Never true  Transportation Needs: No Transportation Needs  . Lack of Transportation (Medical): No  . Lack of Transportation (Non-Medical): No  Physical Activity: Sufficiently Active  . Days of Exercise per Week: 5 days  . Minutes of Exercise per Session: 60 min  Stress: No Stress Concern Present  . Feeling of Stress : Not at all  Social Connections: Moderately Isolated  . Frequency of Communication with Friends and Family: More than three times a week  . Frequency of Social Gatherings with Friends and Family: More than three times a week  . Attends Religious Services: More than 4 times per year  . Active Member of Clubs or Organizations: No  . Attends Archivist Meetings: Never  . Marital Status: Never married    Tobacco Counseling Counseling given: Not Answered   Clinical Intake:                          Activities of Daily Living In your present state of health, do you have any difficulty performing the following activities: 11/04/2020 07/06/2020  Hearing? N N  Comment declines hearing aids -  Vision? N N  Difficulty concentrating or making decisions? N N  Walking or climbing stairs? N N  Dressing or bathing? N N  Doing errands, shopping? N N  Preparing Food and eating ? N -  Using the Toilet? N -  In the past six months, have you accidently leaked urine? N -  Do you have problems with loss of bowel control? N -  Managing your Medications? N -  Managing your Finances? N -  Housekeeping or managing your Housekeeping? N -  Some recent data might be hidden    Patient Care Team: Glean Hess, MD as PCP - General (Internal Medicine) Patrina Levering, DO (General  Surgery) Margaretmary Bayley (Gynecology) Deanna Artis, MD as Referring Physician (Gastroenterology)  Indicate any recent Medical Services you may have received from other than [facility] providers in the past year (date may be approximate).     Assessment:   This is a routine wellness examination for Sharon Ayers.  Hearing/Vision screen  Hearing Screening   125Hz  250Hz  500Hz  1000Hz  2000Hz  3000Hz  4000Hz  6000Hz  8000Hz   Right ear:           Left ear:           Comments: Pt denies hearing difficulty  Vision Screening Comments: Vision screenings done by Dr. Gaspar Bidding in [place] issues and exercise activities discussed: Current Exercise Habits: Home exercise routine, Type of  exercise: walking;yoga, Time (Minutes): 60, Frequency (Times/Week): 5, Weekly Exercise (Minutes/Week): 300, Intensity: Moderate, Exercise limited by: None identified  Goals    . Patient Stated     Patient states she would like to stay healthy and maintain weight.       Depression Screen PHQ 2/9 Scores 11/04/2020 07/06/2020 01/24/2020 07/29/2019 04/22/2019 03/22/2019 02/12/2019  PHQ - 2 Score 0 0 0 0 0 0 0  PHQ- 9 Score - 0 0 - 0 0 0    Fall Risk Fall Risk  11/04/2020 07/06/2020 01/24/2020 02/12/2019 08/14/2018  Falls in the past year? 0 0 0 0 0  Number falls in past yr: 0 - 0 0 0  Injury with Fall? 0 - 0 0 0  Risk for fall due to : No Fall Risks - No Fall Risks - -  Follow up Falls prevention discussed Falls evaluation completed Falls evaluation completed Falls evaluation completed Falls evaluation completed    [place]:  Any stairs in or around the home? Yes  If so, are there any without handrails? No  Home free of loose throw rugs in walkways, pet beds, electrical cords, etc? Yes  Adequate lighting in your home to reduce risk of falls? Yes   ASSISTIVE DEVICES UTILIZED TO PREVENT FALLS:  Life alert? No  Use of a cane, walker or w/c? No  Grab bars in the bathroom? No  Shower chair  or bench in shower? No  Elevated toilet seat or a handicapped toilet? Yes   TIMED UP AND GO:  Was the test performed? No . Telephonic visit.   Cognitive Function: Normal cognitive status assessed by direct observation by this Nurse Health Advisor. No abnormalities found.          Immunizations Immunization History  Administered Date(s) Administered  . Fluad Quad(high Dose 65+) 04/22/2019, 05/15/2020  . Influenza,inj,Quad PF,6+ Mos 06/02/2017, 05/26/2018  . Moderna Sars-Covid-2 Vaccination 08/20/2019, 09/17/2019, 06/12/2020  . Pneumococcal Conjugate-13 08/14/2018  . Pneumococcal Polysaccharide-23 11/25/2019  . Tdap 11/01/2012  . Zoster Recombinat (Shingrix) 07/08/2017, 11/06/2017    TDAP status: Up to date  Flu Vaccine status: Up to date  Pneumococcal vaccine status: Up to date  Covid-19 vaccine status: Completed vaccines  Qualifies for Shingles Vaccine? Yes   Zostavax completed Yes   Shingrix Completed?: Yes  Screening Tests Health Maintenance  Topic Date Due  . COVID-19 Vaccine (4 - Booster for Moderna series) 12/10/2020  . INFLUENZA VACCINE  02/22/2021  . TETANUS/TDAP  11/02/2022  . COLONOSCOPY (Pts 45-80yrs Insurance coverage will need to be confirmed)  03/20/2028  . DEXA SCAN  Completed  . PNA vac Low Risk Adult  Completed  . Hepatitis C Screening  Addressed  . HPV VACCINES  Aged Out    Health Maintenance  There are no preventive care reminders to display for this patient.  Colorectal cancer screening: Type of screening: Colonoscopy. Completed 03/20/18. Repeat every 10 years  Mammogram status: No longer required due to bilateral mastectomy.  Bone Density status: Completed 02/24/20. Results reflect: Bone density results: OSTEOPENIA. Repeat every 2 years.  Lung Cancer Screening: (Low Dose CT Chest recommended if Age 4-80 years, 30 pack-year currently smoking OR have quit w/in 15years.) does not qualify.   Additional Screening:  Hepatitis C Screening:  does qualify; Completed 12/02/15  Vision Screening: Recommended annual ophthalmology exams for early detection of glaucoma and other disorders of the eye. Is the patient up to date with their annual eye exam?  No  Who is the provider or what is the name of the office in which the patient attends annual eye exams? Dr. Arneta Cliche   Dental Screening: Recommended annual dental exams for proper oral hygiene  Community Resource Referral / Chronic Care Management: CRR required this visit?  No   CCM required this visit?  No      Plan:     I have personally reviewed and noted the following in the patient's chart:   . Medical and social history . Use of alcohol, tobacco or illicit drugs  . Current medications and supplements . Functional ability and status . Nutritional status . Physical activity . Advanced directives . List of other physicians . Hospitalizations, surgeries, and ER visits in previous 12 months . Vitals . Screenings to include cognitive, depression, and falls . Referrals and appointments  In addition, I have reviewed and discussed with patient certain preventive protocols, quality metrics, and best practice recommendations. A written personalized care plan for preventive services as well as general preventive health recommendations were provided to patient.      Clemetine Marker, LPN   [id number]   Nurse Notes: none

## 2020-12-04 DIAGNOSIS — Z23 Encounter for immunization: Secondary | ICD-10-CM | POA: Diagnosis not present

## 2021-01-07 ENCOUNTER — Other Ambulatory Visit: Payer: Self-pay | Admitting: Internal Medicine

## 2021-01-07 DIAGNOSIS — I10 Essential (primary) hypertension: Secondary | ICD-10-CM

## 2021-01-29 ENCOUNTER — Other Ambulatory Visit: Payer: Self-pay

## 2021-01-29 ENCOUNTER — Encounter: Payer: Self-pay | Admitting: Internal Medicine

## 2021-01-29 ENCOUNTER — Ambulatory Visit (INDEPENDENT_AMBULATORY_CARE_PROVIDER_SITE_OTHER): Payer: Medicare PPO | Admitting: Internal Medicine

## 2021-01-29 VITALS — BP 127/76 | HR 73 | Temp 98.0°F | Ht 66.0 in | Wt 139.0 lb

## 2021-01-29 DIAGNOSIS — Z0000 Encounter for general adult medical examination without abnormal findings: Secondary | ICD-10-CM

## 2021-01-29 DIAGNOSIS — M810 Age-related osteoporosis without current pathological fracture: Secondary | ICD-10-CM

## 2021-01-29 DIAGNOSIS — K2100 Gastro-esophageal reflux disease with esophagitis, without bleeding: Secondary | ICD-10-CM | POA: Diagnosis not present

## 2021-01-29 DIAGNOSIS — E785 Hyperlipidemia, unspecified: Secondary | ICD-10-CM | POA: Diagnosis not present

## 2021-01-29 DIAGNOSIS — I10 Essential (primary) hypertension: Secondary | ICD-10-CM | POA: Diagnosis not present

## 2021-01-29 LAB — POCT URINALYSIS DIPSTICK
Bilirubin, UA: NEGATIVE
Blood, UA: NEGATIVE
Glucose, UA: NEGATIVE
Ketones, UA: NEGATIVE
Leukocytes, UA: NEGATIVE
Nitrite, UA: NEGATIVE
Protein, UA: NEGATIVE
Spec Grav, UA: 1.01 (ref 1.010–1.025)
Urobilinogen, UA: 0.2 E.U./dL
pH, UA: 5 (ref 5.0–8.0)

## 2021-01-29 MED ORDER — NEBIVOLOL HCL 5 MG PO TABS: 5.0000 mg | ORAL_TABLET | Freq: Every day | ORAL | 3 refills | Status: DC

## 2021-01-29 NOTE — Progress Notes (Signed)
Date:  01/29/2021   Name:  Sharon Ayers   DOB:  [birth year]   MRN:  [id number]   Chief Complaint: Annual Exam (Breast exam no pap) Sharon Ayers is a 70 y.o. female who presents today for her Complete Annual Exam. She feels well. She reports exercising walking 3 miles 5-6 days a week. She reports she is sleeping well.   Mammogram: discontinued due to bilateral mastectomies. DEXA: 02/2020 osteopenia Pap smear: discontinued Colonoscopy: 02/2018 repeat 2029  Immunization History  Administered Date(s) Administered   Fluad Quad(high Dose 65+) 04/22/2019, 05/15/2020   Influenza,inj,Quad PF,6+ Mos 06/02/2017, 05/26/2018   Moderna Sars-Covid-2 Vaccination 08/20/2019, 09/17/2019, 06/12/2020   Pneumococcal Conjugate-13 08/14/2018   Pneumococcal Polysaccharide-23 11/25/2019   Tdap 11/01/2012   Zoster Recombinat (Shingrix) 07/08/2017, 11/06/2017    Hypertension This is a chronic problem. The problem is controlled (125/75 at home). Pertinent negatives include no chest pain, headaches, palpitations or shortness of breath. Past treatments include diuretics, beta blockers and angiotensin blockers. The current treatment provides significant improvement. There is no history of kidney disease, CAD/MI or CVA.  Hyperlipidemia This is a chronic problem. The problem is controlled. Pertinent negatives include no chest pain or shortness of breath. Current antihyperlipidemic treatment includes statins. The current treatment provides significant improvement of lipids.  Gastroesophageal Reflux She complains of heartburn. She reports no abdominal pain, no chest pain, no coughing or no wheezing. This is a recurrent problem. The problem occurs rarely. Pertinent negatives include no fatigue. She has tried a PPI for the symptoms. The treatment provided significant relief.   Lab Results  Component Value Date   CREATININE 0.74 01/24/2020   BUN 18 01/24/2020   NA 138 01/24/2020   K 4.5 01/24/2020   CL  100 01/24/2020   CO2 23 01/24/2020   Lab Results  Component Value Date   CHOL 250 (H) 01/24/2020   HDL 128 01/24/2020   LDLCALC 114 (H) 01/24/2020   TRIG 52 01/24/2020   CHOLHDL 2.0 01/24/2020   Lab Results  Component Value Date   TSH 1.050 01/24/2020   No results found for: HGBA1C Lab Results  Component Value Date   WBC 8.3 01/24/2020   HGB 14.7 01/24/2020   HCT 43.1 01/24/2020   MCV 94 01/24/2020   PLT 277 01/24/2020   Lab Results  Component Value Date   ALT 20 01/24/2020   AST 18 01/24/2020   ALKPHOS 77 01/24/2020   BILITOT 0.4 01/24/2020     Review of Systems  Constitutional:  Negative for chills, fatigue and fever.  HENT:  Negative for congestion, hearing loss, tinnitus, trouble swallowing and voice change.   Eyes:  Negative for visual disturbance.  Respiratory:  Negative for cough, chest tightness, shortness of breath and wheezing.   Cardiovascular:  Negative for chest pain, palpitations and leg swelling.  Gastrointestinal:  Positive for heartburn. Negative for abdominal pain, constipation, diarrhea and vomiting.  Endocrine: Negative for polydipsia and polyuria.  Genitourinary:  Negative for dysuria, frequency, genital sores, vaginal bleeding and vaginal discharge.  Musculoskeletal:  Negative for arthralgias, gait problem and joint swelling.  Skin:  Negative for color change and rash.  Neurological:  Negative for dizziness, tremors, light-headedness and headaches.  Hematological:  Negative for adenopathy. Does not bruise/bleed easily.  Psychiatric/Behavioral:  Negative for dysphoric mood and sleep disturbance. The patient is not nervous/anxious.    Patient Active Problem List   Diagnosis Date Noted   Collagenous colitis 07/25/2017   Dyslipidemia 05/27/2015   Essential (  primary) hypertension 05/27/2015   Generalized OA 05/27/2015   Esophagitis, reflux 05/27/2015   OP (osteoporosis) 05/27/2015   History of right breast cancer 03/12/2014   Abnormal Pap  smear of cervix 03/26/2013   Carotid artery narrowing 11/27/2012    Allergies  Allergen Reactions   Diclofenac Diarrhea and Nausea And Vomiting   Ace Inhibitors Cough and Other (See Comments)    Other reaction(s): Cough   Atorvastatin Other (See Comments)    Severe back pain Severe back pain   Olmesartan Diarrhea    Past Surgical History:  Procedure Laterality Date   BILATERAL TOTAL MASTECTOMY WITH AXILLARY LYMPH NODE DISSECTION  05/2014   DCIS right breast ER/PR-   CAROTID ENDARTERECTOMY  2009   COLONOSCOPY  2008    Social History   Tobacco Use   Smoking status: Never   Smokeless tobacco: Never  Vaping Use   Vaping Use: Never used  Substance Use Topics   Alcohol use: Yes    Alcohol/week: 2.0 standard drinks    Types: 2 Standard drinks or equivalent per week   Drug use: Never     Medication list has been reviewed and updated.  Current Meds  Medication Sig   Ascorbic Acid (VITAMIN C) 1000 MG tablet Take 1,000 mg by mouth daily.   aspirin 325 MG tablet Take 1 tablet by mouth daily.   calcium carbonate (OSCAL) 1500 (600 Ca) MG TABS tablet Take by mouth 2 (two) times daily with a meal.   ELDERBERRY PO Take by mouth in the morning and at bedtime.   hydrochlorothiazide (MICROZIDE) 12.5 MG capsule TAKE 1 CAPSULE EVERY DAY   MULTIPLE VITAMIN PO Take by mouth.   Omega-3 Fatty Acids (FISH OIL BURP-LESS) 1000 MG CAPS Take 1 capsule by mouth daily.   Omeprazole-Sodium Bicarbonate (ZEGERID OTC) 20-1100 MG CAPS capsule Take 1 capsule by mouth daily.   pravastatin (PRAVACHOL) 20 MG tablet Take 1 tablet (20 mg total) by mouth daily.   valsartan (DIOVAN) 320 MG tablet Take 1 tablet (320 mg total) by mouth daily.   [DISCONTINUED] nebivolol (BYSTOLIC) 5 MG tablet TAKE 1 TABLET EVERY DAY    PHQ 2/9 Scores 01/29/2021 11/04/2020 07/06/2020 01/24/2020  PHQ - 2 Score 0 0 0 0  PHQ- 9 Score 0 - 0 0    GAD 7 : Generalized Anxiety Score 01/29/2021 07/06/2020 01/24/2020  Nervous, Anxious, on  Edge 0 0 0  Control/stop worrying 0 0 0  Worry too much - different things 0 0 0  Trouble relaxing 0 0 0  Restless 0 0 0  Easily annoyed or irritable 0 0 0  Afraid - awful might happen 0 0 0  Total GAD 7 Score 0 0 0  Anxiety Difficulty Not difficult at all - Not difficult at all    BP Readings from Last 3 Encounters:  01/29/21 127/76  07/06/20 112/74  01/24/20 124/70    Physical Exam Vitals and nursing note reviewed.  Constitutional:      General: She is not in acute distress.    Appearance: She is well-developed.  HENT:     Head: Normocephalic and atraumatic.     Right Ear: Tympanic membrane and ear canal normal.     Left Ear: Tympanic membrane and ear canal normal.     Nose:     Right Sinus: No maxillary sinus tenderness.     Left Sinus: No maxillary sinus tenderness.  Eyes:     General: No scleral icterus.  Right eye: No discharge.        Left eye: No discharge.     Conjunctiva/sclera: Conjunctivae normal.  Neck:     Thyroid: No thyromegaly.     Vascular: No carotid bruit.  Cardiovascular:     Rate and Rhythm: Normal rate and regular rhythm.     Pulses: Normal pulses.     Heart sounds: Normal heart sounds.  Pulmonary:     Effort: Pulmonary effort is normal. No respiratory distress.     Breath sounds: No wheezing.  Chest:  Breasts:    Right: Absent.     Left: Absent.  Abdominal:     General: Bowel sounds are normal.     Palpations: Abdomen is soft.     Tenderness: There is no abdominal tenderness.  Musculoskeletal:     Cervical back: Normal range of motion. No erythema.     Right lower leg: No edema.     Left lower leg: No edema.  Lymphadenopathy:     Cervical: No cervical adenopathy.  Skin:    General: Skin is warm and dry.     Findings: No rash.  Neurological:     Mental Status: She is alert and oriented to person, place, and time.     Cranial Nerves: No cranial nerve deficit.     Sensory: No sensory deficit.     Deep Tendon Reflexes: Reflexes  are normal and symmetric.  Psychiatric:        Attention and Perception: Attention normal.        Mood and Affect: Mood normal.    Wt Readings from Last 3 Encounters:  01/29/21 139 lb (63 kg)  11/04/20 135 lb (61.2 kg)  07/06/20 139 lb (63 kg)    BP 127/76 Comment: at home  Pulse 73   Temp 98 F (36.7 C) (Oral)   Ht 5\' 6"  (1.676 m)   Wt 139 lb (63 kg)   SpO2 99%   BMI 22.44 kg/m   Assessment and Plan: 1. Annual physical exam Normal exam Immunizations are up to date Mammograms discontinued  2. Essential (primary) hypertension Clinically stable exam with well controlled BP. Tolerating medications without side effects at this time. Pt to continue current regimen and low sodium diet; benefits of regular exercise as able discussed. - Comprehensive metabolic panel - TSH - POCT urinalysis dipstick - nebivolol (BYSTOLIC) 5 MG tablet; Take 1 tablet (5 mg total) by mouth daily.  Dispense: 90 tablet; Refill: 3  3. Dyslipidemia Check labs on Pravastatin - Lipid panel  4. Gastroesophageal reflux disease with esophagitis without hemorrhage Symptoms well controlled on daily PPI No red flag signs such as weight loss, n/v, melena Will continue daily PP. - CBC with Differential/Platelet  5. Age-related osteoporosis without current pathological fracture Last DEXA showed osteopenia Continue exercise, calcium and vitamin D   Partially dictated using Editor, commissioning. Any errors are unintentional.  Halina Maidens, MD [facility]  01/29/2021

## 2021-01-30 LAB — COMPREHENSIVE METABOLIC PANEL
ALT: 23 IU/L (ref 0–32)
AST: 23 IU/L (ref 0–40)
Albumin/Globulin Ratio: 2.8 — ABNORMAL HIGH (ref 1.2–2.2)
Albumin: 5.1 g/dL — ABNORMAL HIGH (ref 3.8–4.8)
Alkaline Phosphatase: 82 IU/L (ref 44–121)
BUN/Creatinine Ratio: 18 (ref 12–28)
BUN: 15 mg/dL (ref 8–27)
Bilirubin Total: 0.4 mg/dL (ref 0.0–1.2)
CO2: 22 mmol/L (ref 20–29)
Calcium: 10.2 mg/dL (ref 8.7–10.3)
Chloride: 100 mmol/L (ref 96–106)
Creatinine, Ser: 0.84 mg/dL (ref 0.57–1.00)
Globulin, Total: 1.8 g/dL (ref 1.5–4.5)
Glucose: 106 mg/dL — ABNORMAL HIGH (ref 65–99)
Potassium: 5.4 mmol/L — ABNORMAL HIGH (ref 3.5–5.2)
Sodium: 143 mmol/L (ref 134–144)
Total Protein: 6.9 g/dL (ref 6.0–8.5)
eGFR: 76 mL/min/{1.73_m2} (ref >59)

## 2021-01-30 LAB — CBC WITH DIFFERENTIAL/PLATELET
Basophils Absolute: 0.1 10*3/uL (ref 0.0–0.2)
Basos: 1 %
EOS (ABSOLUTE): 0.3 10*3/uL (ref 0.0–0.4)
Eos: 4 %
Hematocrit: 42 % (ref 34.0–46.6)
Hemoglobin: 14.2 g/dL (ref 11.1–15.9)
Immature Grans (Abs): 0 10*3/uL (ref 0.0–0.1)
Immature Granulocytes: 0 %
Lymphocytes Absolute: 2.5 10*3/uL (ref 0.7–3.1)
Lymphs: 30 %
MCH: 31.7 pg (ref 26.6–33.0)
MCHC: 33.8 g/dL (ref 31.5–35.7)
MCV: 94 fL (ref 79–97)
Monocytes Absolute: 0.7 10*3/uL (ref 0.1–0.9)
Monocytes: 9 %
Neutrophils Absolute: 4.8 10*3/uL (ref 1.4–7.0)
Neutrophils: 56 %
Platelets: 266 10*3/uL (ref 150–450)
RBC: 4.48 x10E6/uL (ref 3.77–5.28)
RDW: 12.4 % (ref 11.7–15.4)
WBC: 8.4 10*3/uL (ref 3.4–10.8)

## 2021-01-30 LAB — LIPID PANEL
Chol/HDL Ratio: 2 ratio (ref 0.0–4.4)
Cholesterol, Total: 274 mg/dL — ABNORMAL HIGH (ref 100–199)
HDL: 139 mg/dL (ref >39)
LDL Chol Calc (NIH): 125 mg/dL — ABNORMAL HIGH (ref 0–99)
Triglycerides: 65 mg/dL (ref 0–149)
VLDL Cholesterol Cal: 10 mg/dL (ref 5–40)

## 2021-01-30 LAB — TSH: TSH: 1.56 u[IU]/mL (ref 0.450–4.500)

## 2021-03-28 ENCOUNTER — Telehealth: Payer: Self-pay | Admitting: Internal Medicine

## 2021-03-28 DIAGNOSIS — I10 Essential (primary) hypertension: Secondary | ICD-10-CM

## 2021-03-28 NOTE — Telephone Encounter (Signed)
last RF 06/08/20 #90 3 RF--too soon

## 2021-04-05 ENCOUNTER — Other Ambulatory Visit: Payer: Self-pay

## 2021-04-05 DIAGNOSIS — I10 Essential (primary) hypertension: Secondary | ICD-10-CM

## 2021-04-05 NOTE — Telephone Encounter (Signed)
Patient called in to inform Dr Army Melia that [place] need a new Rx for the HCTZ because she will be out soon and Rx on file have expired.

## 2021-04-05 NOTE — Telephone Encounter (Signed)
Patient should have refills until November coming from [employer] [facility].

## 2021-04-19 ENCOUNTER — Other Ambulatory Visit: Payer: Self-pay

## 2021-04-19 DIAGNOSIS — I10 Essential (primary) hypertension: Secondary | ICD-10-CM

## 2021-04-19 MED ORDER — HYDROCHLOROTHIAZIDE 12.5 MG PO CAPS: 12.5000 mg | ORAL_CAPSULE | Freq: Every day | ORAL | 0 refills | Status: DC

## 2021-04-19 NOTE — Telephone Encounter (Signed)
Pt is calling and per pt she does not have any more refill on HCTZ to last until nov. Please sent refill to [facility]

## 2021-04-19 NOTE — Telephone Encounter (Signed)
Sent in 90 days  

## 2021-04-21 ENCOUNTER — Other Ambulatory Visit: Payer: Self-pay | Admitting: Internal Medicine

## 2021-04-21 DIAGNOSIS — I10 Essential (primary) hypertension: Secondary | ICD-10-CM

## 2021-04-21 DIAGNOSIS — E785 Hyperlipidemia, unspecified: Secondary | ICD-10-CM

## 2021-04-21 NOTE — Telephone Encounter (Signed)
Future visit in 3 months  

## 2021-07-01 ENCOUNTER — Other Ambulatory Visit: Payer: Self-pay | Admitting: Internal Medicine

## 2021-07-01 DIAGNOSIS — I10 Essential (primary) hypertension: Secondary | ICD-10-CM

## 2021-07-01 NOTE — Telephone Encounter (Signed)
Requested Prescriptions  Pending Prescriptions Disp Refills  . hydrochlorothiazide (MICROZIDE) 12.5 MG capsule [Pharmacy Med Name: HYDROCHLOROTHIAZIDE 12.5 MG Capsule] 90 capsule 0    Sig: TAKE 1 CAPSULE (12.5 MG TOTAL) BY MOUTH DAILY.     Cardiovascular: Diuretics - Thiazide Failed - 07/01/2021  3:58 AM      Failed - K in normal range and within 360 days    Potassium  Date Value Ref Range Status  01/29/2021 5.4 (H) 3.5 - 5.2 mmol/L Final         Passed - Ca in normal range and within 360 days    Calcium  Date Value Ref Range Status  01/29/2021 10.2 8.7 - 10.3 mg/dL Final         Passed - Cr in normal range and within 360 days    Creatinine, Ser  Date Value Ref Range Status  01/29/2021 0.84 0.57 - 1.00 mg/dL Final         Passed - Na in normal range and within 360 days    Sodium  Date Value Ref Range Status  01/29/2021 143 134 - 144 mmol/L Final         Passed - Last BP in normal range    BP Readings from Last 1 Encounters:  01/29/21 127/76         Passed - Valid encounter within last 6 months    Recent Outpatient Visits          5 months ago Annual physical exam   [facility] Glean Hess, MD   12 months ago Essential (primary) hypertension   [facility] Glean Hess, MD   1 year ago Annual physical exam   [facility] Glean Hess, MD   1 year ago Acute otitis media, unspecified otitis media type   [facility] Glean Hess, MD   2 years ago Essential (primary) hypertension   [place], Laura H, MD      Future Appointments            In 1 month Army Melia Jesse Sans, MD [facility] [place], [place]   In 7 months Army Melia, Jesse Sans, MD [facility], [facility] [place]

## 2021-07-23 DIAGNOSIS — Z23 Encounter for immunization: Secondary | ICD-10-CM | POA: Diagnosis not present

## 2021-08-02 ENCOUNTER — Encounter: Payer: Self-pay | Admitting: Internal Medicine

## 2021-08-02 ENCOUNTER — Other Ambulatory Visit: Payer: Self-pay

## 2021-08-02 ENCOUNTER — Ambulatory Visit: Payer: Medicare PPO | Admitting: Internal Medicine

## 2021-08-02 VITALS — BP 138/80 | HR 75 | Ht 66.0 in | Wt 140.0 lb

## 2021-08-02 DIAGNOSIS — I10 Essential (primary) hypertension: Secondary | ICD-10-CM | POA: Diagnosis not present

## 2021-08-02 DIAGNOSIS — E785 Hyperlipidemia, unspecified: Secondary | ICD-10-CM | POA: Diagnosis not present

## 2021-08-02 MED ORDER — PRAVASTATIN SODIUM 20 MG PO TABS: 20.0000 mg | ORAL_TABLET | Freq: Every day | ORAL | 1 refills | Status: DC

## 2021-08-02 MED ORDER — VALSARTAN 320 MG PO TABS: 320.0000 mg | ORAL_TABLET | Freq: Every day | ORAL | 1 refills | Status: DC

## 2021-08-02 NOTE — Progress Notes (Signed)
Date:  08/02/2021   Name:  Sharon Ayers   DOB:  05/16/1951   MRN:  [id number]   Chief Complaint: Hypertension  Hypertension This is a chronic problem. The problem is controlled (120/68 at home). Pertinent negatives include no chest pain, headaches, palpitations or shortness of breath. Past treatments include angiotensin blockers, diuretics and beta blockers. The current treatment provides significant improvement. There are no compliance problems.  There is no history of kidney disease, CAD/MI or CVA.  Hyperlipidemia This is a chronic problem. The problem is controlled. Pertinent negatives include no chest pain or shortness of breath. Current antihyperlipidemic treatment includes statins. The current treatment provides significant improvement of lipids.   Lab Results  Component Value Date   NA 143 01/29/2021   K 5.4 (H) 01/29/2021   CO2 22 01/29/2021   GLUCOSE 106 (H) 01/29/2021   BUN 15 01/29/2021   CREATININE 0.84 01/29/2021   CALCIUM 10.2 01/29/2021   EGFR 76 01/29/2021   GFRNONAA 85 01/24/2020   Lab Results  Component Value Date   CHOL 274 (H) 01/29/2021   HDL 139 01/29/2021   LDLCALC 125 (H) 01/29/2021   TRIG 65 01/29/2021   CHOLHDL 2.0 01/29/2021   Lab Results  Component Value Date   TSH 1.560 01/29/2021   No results found for: HGBA1C Lab Results  Component Value Date   WBC 8.4 01/29/2021   HGB 14.2 01/29/2021   HCT 42.0 01/29/2021   MCV 94 01/29/2021   PLT 266 01/29/2021   Lab Results  Component Value Date   ALT 23 01/29/2021   AST 23 01/29/2021   ALKPHOS 82 01/29/2021   BILITOT 0.4 01/29/2021   No results found for: 25OHVITD2, 25OHVITD3, VD25OH   Review of Systems  Constitutional:  Negative for fatigue and unexpected weight change.  HENT:  Negative for nosebleeds.   Eyes:  Negative for visual disturbance.  Respiratory:  Negative for cough, chest tightness, shortness of breath and wheezing.   Cardiovascular:  Negative for chest pain,  palpitations and leg swelling.  Gastrointestinal:  Negative for abdominal pain, constipation and diarrhea.  Neurological:  Negative for dizziness, weakness, light-headedness and headaches.   Patient Active Problem List   Diagnosis Date Noted   Collagenous colitis 07/25/2017   Dyslipidemia 05/27/2015   Essential (primary) hypertension 05/27/2015   Generalized OA 05/27/2015   Esophagitis, reflux 05/27/2015   OP (osteoporosis) 05/27/2015   History of right breast cancer 03/12/2014   Abnormal Pap smear of cervix 03/26/2013   Carotid artery narrowing 11/27/2012    Allergies  Allergen Reactions   Diclofenac Diarrhea and Nausea And Vomiting   Ace Inhibitors Cough and Other (See Comments)    Other reaction(s): Cough   Atorvastatin Other (See Comments)    Severe back pain Severe back pain   Olmesartan Diarrhea    Past Surgical History:  Procedure Laterality Date   BILATERAL TOTAL MASTECTOMY WITH AXILLARY LYMPH NODE DISSECTION  05/2014   DCIS right breast ER/PR-   CAROTID ENDARTERECTOMY  2009   COLONOSCOPY  2008    Social History   Tobacco Use   Smoking status: Never   Smokeless tobacco: Never  Vaping Use   Vaping Use: Never used  Substance Use Topics   Alcohol use: Yes    Alcohol/week: 2.0 standard drinks    Types: 2 Standard drinks or equivalent per week   Drug use: Never     Medication list has been reviewed and updated.  Current Meds  Medication Sig  Ascorbic Acid (VITAMIN C) 1000 MG tablet Take 1,000 mg by mouth daily.  ° aspirin 325 MG tablet Take 1 tablet by mouth daily.  ° calcium carbonate (OSCAL) 1500 (600 Ca) MG TABS tablet Take by mouth 2 (two) times daily with a meal.  ° ELDERBERRY PO Take by mouth in the morning and at bedtime.  ° hydrochlorothiazide (MICROZIDE) 12.5 MG capsule TAKE 1 CAPSULE (12.5 MG TOTAL) BY MOUTH DAILY.  ° MULTIPLE VITAMIN PO Take by mouth.  ° nebivolol (BYSTOLIC) 5 MG tablet Take 1 tablet (5 mg total) by mouth daily.  ° Omega-3 Fatty  Acids (FISH OIL BURP-LESS) 1000 MG CAPS Take 1 capsule by mouth daily.  ° Omeprazole-Sodium Bicarbonate (ZEGERID OTC) 20-1100 MG CAPS capsule Take 1 capsule by mouth daily.  ° pravastatin (PRAVACHOL) 20 MG tablet TAKE 1 TABLET EVERY DAY  ° valsartan (DIOVAN) 320 MG tablet TAKE 1 TABLET EVERY DAY  ° ° °PHQ 2/9 Scores 08/02/2021 01/29/2021 11/04/2020 07/06/2020  °PHQ - 2 Score 0 0 0 0  °PHQ- 9 Score 0 0 - 0  ° ° °GAD 7 : Generalized Anxiety Score 08/02/2021 01/29/2021 07/06/2020 01/24/2020  °Nervous, Anxious, on Edge 0 0 0 0  °Control/stop worrying 0 0 0 0  °Worry too much - different things 0 0 0 0  °Trouble relaxing 0 0 0 0  °Restless 0 0 0 0  °Easily annoyed or irritable 0 0 0 0  °Afraid - awful might happen 0 0 0 0  °Total GAD 7 Score 0 0 0 0  °Anxiety Difficulty - Not difficult at all - Not difficult at all  ° ° °BP Readings from Last 3 Encounters:  °08/02/21 138/80  °01/29/21 127/76  °07/06/20 112/74  ° ° °Physical Exam °Vitals and nursing note reviewed.  °Constitutional:   °   General: She is not in acute distress. °   Appearance: Normal appearance. She is well-developed.  °HENT:  °   Head: Normocephalic and atraumatic.  °Neck:  °   Vascular: No carotid bruit.  °Cardiovascular:  °   Rate and Rhythm: Normal rate and regular rhythm.  °   Pulses: Normal pulses.  °   Heart sounds: No murmur heard. °Pulmonary:  °   Effort: Pulmonary effort is normal. No respiratory distress.  °   Breath sounds: No wheezing or rhonchi.  °Musculoskeletal:     °   General: Normal range of motion.  °   Cervical back: Normal range of motion.  °   Right lower leg: No edema.  °   Left lower leg: No edema.  °Lymphadenopathy:  °   Cervical: No cervical adenopathy.  °Skin: °   General: Skin is warm and dry.  °   Findings: No rash.  °Neurological:  °   Mental Status: She is alert and oriented to person, place, and time.  °Psychiatric:     °   Mood and Affect: Mood normal.     °   Behavior: Behavior normal.  ° ° °Wt Readings from Last 3 Encounters:   °08/02/21 140 lb (63.5 kg)  °01/29/21 139 lb (63 kg)  °11/04/20 135 lb (61.2 kg)  ° ° °BP 138/80    Pulse 75    Ht 5' 6" (1.676 m)    Wt 140 lb (63.5 kg)    SpO2 99%    BMI 22.60 kg/m²  ° °Assessment and Plan: °1. Essential (primary) hypertension °Clinically stable exam with well controlled BP. °Tolerating medications without side effects   at this time. Pt to continue current regimen and low sodium diet; benefits of regular exercise as able discussed. - valsartan (DIOVAN) 320 MG tablet; Take 1 tablet (320 mg total) by mouth daily.  Dispense: 90 tablet; Refill: 1  2. Dyslipidemia Tolerating statin medication without side effects at this time LDL is not at goal of < 70 on current dose but HDL is extremely high. Continue same therapy without change at this time. - pravastatin (PRAVACHOL) 20 MG tablet; Take 1 tablet (20 mg total) by mouth daily.  Dispense: 90 tablet; Refill: 1   Partially dictated using Editor, commissioning. Any errors are unintentional.  Halina Maidens, MD [facility]  08/02/2021

## 2021-11-08 ENCOUNTER — Ambulatory Visit (INDEPENDENT_AMBULATORY_CARE_PROVIDER_SITE_OTHER): Payer: Medicare PPO

## 2021-11-08 DIAGNOSIS — Z0000 Encounter for general adult medical examination without abnormal findings: Secondary | ICD-10-CM | POA: Diagnosis not present

## 2021-11-08 NOTE — Progress Notes (Signed)
? ?Subjective:  ? Sharon Ayers is a 71 y.o. female who presents for Medicare Annual (Subsequent) preventive examination. ? ?Virtual Visit via Telephone Note ? ?I connected with  Sharon Ayers on 11/08/21 at 11:00 AM EDT by telephone and verified that I am speaking with the correct person using two identifiers. ? ?Location: ?Patient: home ?Provider: [facility] ?Persons participating in the virtual visit: patient/Nurse Health Advisor ?  ?I discussed the limitations, risks, security and privacy concerns of performing an evaluation and management service by telephone and the availability of in person appointments. The patient expressed understanding and agreed to proceed. ? ?Interactive audio and video telecommunications were attempted between this nurse and patient, however failed, due to patient having technical difficulties OR patient did not have access to video capability.  We continued and completed visit with audio only. ? ?Some vital signs may be absent or patient reported.  ? ?Clemetine Marker, LPN ? ? ?Review of Systems    ? ?Cardiac Risk Factors include: advanced age (>10mn, >>57women);dyslipidemia;hypertension ? ?   ?Objective:  ?  ?There were no vitals filed for this visit. ?There is no height or weight on file to calculate BMI. ? ? ?  11/08/2021  ? 10:57 AM 11/04/2020  ? 12:56 PM 12/02/2015  ?  8:26 AM  ?Advanced Directives  ?Does Patient Have a Medical Advance Directive? Yes Yes No  ?Type of AParamedicof [place]Living will [place]odus Point[place] will   ?Copy of [place] Chart? No - copy requested No - copy requested   ? ? ?Current Medications (verified) ?Outpatient Encounter Medications as of 11/08/2021  ?Medication Sig  ? Ascorbic Acid (VITAMIN C) 1000 MG tablet Take 1,000 mg by mouth daily.  ? aspirin 325 MG tablet Take 1 tablet by mouth daily.  ? calcium carbonate (OSCAL) 1500 (600 Ca) MG TABS tablet Take by mouth 2 (two) times daily with a  meal.  ? ELDERBERRY PO Take by mouth in the morning and at bedtime.  ? hydrochlorothiazide (MICROZIDE) 12.5 MG capsule TAKE 1 CAPSULE (12.5 MG TOTAL) BY MOUTH DAILY.  ? MULTIPLE VITAMIN PO Take by mouth.  ? nebivolol (BYSTOLIC) 5 MG tablet Take 1 tablet (5 mg total) by mouth daily.  ? Omega-3 Fatty Acids (FISH OIL BURP-LESS) 1000 MG CAPS Take 1 capsule by mouth daily.  ? Omeprazole-Sodium Bicarbonate (ZEGERID OTC) 20-1100 MG CAPS capsule Take 1 capsule by mouth daily.  ? pravastatin (PRAVACHOL) 20 MG tablet Take 1 tablet (20 mg total) by mouth daily.  ? valsartan (DIOVAN) 320 MG tablet Take 1 tablet (320 mg total) by mouth daily.  ? ?No facility-administered encounter medications on file as of 11/08/2021.  ? ? ?Allergies (verified) ?Diclofenac, Ace inhibitors, Atorvastatin, and Olmesartan  ? ?History: ?Past Medical History:  ?Diagnosis Date  ? GERD (gastroesophageal reflux disease)   ? Hypertension   ? ?Past Surgical History:  ?Procedure Laterality Date  ? BILATERAL TOTAL MASTECTOMY WITH AXILLARY LYMPH NODE DISSECTION  05/2014  ? DCIS right breast ER/PR-  ? CAROTID ENDARTERECTOMY  2009  ? COLONOSCOPY  2008  ? ?Family History  ?Problem Relation Age of Onset  ? Hypertension Mother   ? Hypertension Father   ? ?Social History  ? ?Socioeconomic History  ? Marital status: Single  ?  Spouse name: Not on file  ? Number of children: Not on file  ? Years of education: Not on file  ? Highest education level: Not on file  ?  Occupational History  ? Not on file  ?Tobacco Use  ? Smoking status: Never  ? Smokeless tobacco: Never  ?Vaping Use  ? Vaping Use: Never used  ?Substance and Sexual Activity  ? Alcohol use: Yes  ?  Alcohol/week: 2.0 standard drinks  ?  Types: 2 Standard drinks or equivalent per week  ? Drug use: Never  ? Sexual activity: Not on file  ?Other Topics Concern  ? Not on file  ?Social History Narrative  ? Pt lives alone  ? ?Social Determinants of Health  ? ?Financial Resource Strain: Low Risk   ? Difficulty of  Paying Living Expenses: Not hard at all  ?Food Insecurity: No Food Insecurity  ? Worried About Charity fundraiser in the Last Year: Never true  ? Ran Out of Food in the Last Year: Never true  ?Transportation Needs: No Transportation Needs  ? Lack of Transportation (Medical): No  ? Lack of Transportation (Non-Medical): No  ?Physical Activity: Sufficiently Active  ? Days of Exercise per Week: 5 days  ? Minutes of Exercise per Session: 60 min  ?Stress: No Stress Concern Present  ? Feeling of Stress : Not at all  ?Social Connections: Moderately Isolated  ? Frequency of Communication with Friends and Family: More than three times a week  ? Frequency of Social Gatherings with Friends and Family: More than three times a week  ? Attends Religious Services: More than 4 times per year  ? Active Member of Clubs or Organizations: No  ? Attends Archivist Meetings: Never  ? Marital Status: Never married  ? ? ?Tobacco Counseling ?Counseling given: Not Answered ? ? ?Clinical Intake: ? ?Pre-visit preparation completed: Yes ? ?Pain : No/denies pain ? ?  ? ?Nutritional Risks: None ?Diabetes: No ? ?How often do you need to have someone help you when you read instructions, pamphlets, or other written materials from your doctor or pharmacy?: 1 - Never ? ? ?Interpreter Needed?: No ? ?Information entered by :: Clemetine Marker LPN ? ? ?Activities of Daily Living ? ?  11/08/2021  ? 10:57 AM 08/02/2021  ?  8:10 AM  ?In your present state of health, do you have any difficulty performing the following activities:  ?Hearing? 0 0  ?Vision? 0 0  ?Difficulty concentrating or making decisions? 0 0  ?Walking or climbing stairs? 0 0  ?Dressing or bathing? 0 0  ?Doing errands, shopping? 0 0  ?Preparing Food and eating ? N   ?Using the Toilet? N   ?In the past six months, have you accidently leaked urine? N   ?Do you have problems with loss of bowel control? N   ?Managing your Medications? N   ?Managing your Finances? N   ?Housekeeping or managing  your Housekeeping? N   ? ? ?Patient Care Team: ?Glean Hess, MD as PCP - General (Internal Medicine) ?Patrina Levering, DO (General Surgery) ?Margaretmary Bayley (Gynecology) ?Deanna Artis, MD as Referring Physician (Gastroenterology) ? ?Indicate any recent Medical Services you may have received from other than [facility] providers in the past year (date may be approximate). ? ?   ?Assessment:  ? This is a routine wellness examination for Sharon Ayers. ? ?Hearing/Vision screen ?Hearing Screening - Comments:: Pt denies hearing difficulty ?Vision Screening - Comments:: Vision screenings done by Dr. Gaspar Bidding in [place] ? ?Dietary issues and exercise activities discussed: ?Current Exercise Habits: Home exercise routine, Type of exercise: walking;Other - see comments (outside work), Time (Minutes): 60, Frequency (Times/Week):  5, Weekly Exercise (Minutes/Week): 300, Intensity: Moderate, Exercise limited by: None identified ? ? Goals Addressed   ? ?  ?  ?  ?  ? This Visit's Progress  ?  Patient Stated   On track  ?  Patient states she would like to stay healthy and maintain weight.  ?  ? ?  ? ?Depression Screen ? ?  11/08/2021  ? 10:56 AM 08/02/2021  ?  8:10 AM 01/29/2021  ?  8:36 AM 11/04/2020  ? 12:54 PM 07/06/2020  ? 10:54 AM 01/24/2020  ?  8:54 AM 07/29/2019  ?  3:40 PM  ?PHQ 2/9 Scores  ?PHQ - 2 Score 0 0 0 0 0 0 0  ?PHQ- 9 Score  0 0  0 0   ?  ?Fall Risk ? ?  11/08/2021  ? 10:57 AM 08/02/2021  ?  8:10 AM 01/29/2021  ?  8:36 AM 11/04/2020  ? 12:57 PM 07/06/2020  ? 10:54 AM  ?Fall Risk   ?Falls in the past year? 0 0 0 0 0  ?Number falls in past yr: 0 0 0 0   ?Injury with Fall? 0 0 0 0   ?Risk for fall due to : No Fall Risks No Fall Risks  No Fall Risks   ?Follow up Falls prevention discussed Falls evaluation completed Falls evaluation completed Falls prevention discussed Falls evaluation completed  ? ? ?FALL RISK PREVENTION PERTAINING TO THE HOME: ? ?Any stairs in or around the home? Yes  ?If so, are there any without handrails? No   ?Home free of loose throw rugs in walkways, pet beds, electrical cords, etc? Yes  ?Adequate lighting in your home to reduce risk of falls? Yes  ? ?ASSISTIVE DEVICES UTILIZED TO PREVENT FALLS: ? ?Life alert? No

## 2021-11-08 NOTE — Patient Instructions (Signed)
Sharon Ayers , ?Thank you for taking time to come for your Medicare Wellness Visit. I appreciate your ongoing commitment to your health goals. Please review the following plan we discussed and let me know if I can assist you in the future.  ? ?Screening recommendations/referrals: ?Colonoscopy: done 03/20/18. Repeat 02/2028 ?Bone Density: done 02/24/20 ?Recommended yearly ophthalmology/optometry visit for glaucoma screening and checkup ?Recommended yearly dental visit for hygiene and checkup ? ?Vaccinations: ?Influenza vaccine: done 05/17/21 ?Pneumococcal vaccine: done 11/25/19 ?Tdap vaccine: done 11/01/12 ?Shingles vaccine: done 07/08/17 & 11/06/17   ?Covid-19:done 08/20/19, 09/20/19, 06/12/20, 12/04/20 & 07/23/21 ? ?Advanced directives: Please bring a copy of your health care power of attorney and living will to the office at your convenience.  ? ?Conditions/risks identified: Keep up the great work! ? ?Next appointment: Follow up in one year for your annual wellness visit  ? ? ?Preventive Care 48 Years and Older, Female ?Preventive care refers to lifestyle choices and visits with your health care provider that can promote health and wellness. ?What does preventive care include? ?A yearly physical exam. This is also called an annual well check. ?Dental exams once or twice a year. ?Routine eye exams. Ask your health care provider how often you should have your eyes checked. ?Personal lifestyle choices, including: ?Daily care of your teeth and gums. ?Regular physical activity. ?Eating a healthy diet. ?Avoiding tobacco and drug use. ?Limiting alcohol use. ?Practicing safe sex. ?Taking low-dose aspirin every day. ?Taking vitamin and mineral supplements as recommended by your health care provider. ?What happens during an annual well check? ?The services and screenings done by your health care provider during your annual well check will depend on your age, overall health, lifestyle risk factors, and family history of  disease. ?Counseling  ?Your health care provider may ask you questions about your: ?Alcohol use. ?Tobacco use. ?Drug use. ?Emotional well-being. ?Home and relationship well-being. ?Sexual activity. ?Eating habits. ?History of falls. ?Memory and ability to understand (cognition). ?Work and work Statistician. ?Reproductive health. ?Screening  ?You may have the following tests or measurements: ?Height, weight, and BMI. ?Blood pressure. ?Lipid and cholesterol levels. These may be checked every 5 years, or more frequently if you are over 70 years old. ?Skin check. ?Lung cancer screening. You may have this screening every year starting at age 51 if you have a 30-pack-year history of smoking and currently smoke or have quit within the past 15 years. ?Fecal occult blood test (FOBT) of the stool. You may have this test every year starting at age 43. ?Flexible sigmoidoscopy or colonoscopy. You may have a sigmoidoscopy every 5 years or a colonoscopy every 10 years starting at age 45. ?Hepatitis C blood test. ?Hepatitis B blood test. ?Sexually transmitted disease (STD) testing. ?Diabetes screening. This is done by checking your blood sugar (glucose) after you have not eaten for a while (fasting). You may have this done every 1-3 years. ?Bone density scan. This is done to screen for osteoporosis. You may have this done starting at age 47. ?Mammogram. This may be done every 1-2 years. Talk to your health care provider about how often you should have regular mammograms. ?Talk with your health care provider about your test results, treatment options, and if necessary, the need for more tests. ?Vaccines  ?Your health care provider may recommend certain vaccines, such as: ?Influenza vaccine. This is recommended every year. ?Tetanus, diphtheria, and acellular pertussis (Tdap, Td) vaccine. You may need a Td booster every 10 years. ?Zoster vaccine. You may need this  after age 4. ?Pneumococcal 13-valent conjugate (PCV13) vaccine. One  dose is recommended after age 63. ?Pneumococcal polysaccharide (PPSV23) vaccine. One dose is recommended after age 46. ?Talk to your health care provider about which screenings and vaccines you need and how often you need them. ?This information is not intended to replace advice given to you by your health care provider. Make sure you discuss any questions you have with your health care provider. ?Document Released: 08/07/2015 Document Revised: 03/30/2016 Document Reviewed: 05/12/2015 ?Elsevier Interactive Patient Education ? 2017 [place]. ? ?Fall Prevention in the Home ?Falls can cause injuries. They can happen to people of all ages. There are many things you can do to make your home safe and to help prevent falls. ?What can I do on the outside of my home? ?Regularly fix the edges of walkways and driveways and fix any cracks. ?Remove anything that might make you trip as you walk through a door, such as a raised step or threshold. ?Trim any bushes or trees on the path to your home. ?Use bright outdoor lighting. ?Clear any walking paths of anything that might make someone trip, such as rocks or tools. ?Regularly check to see if handrails are loose or broken. Make sure that both sides of any steps have handrails. ?Any raised decks and porches should have guardrails on the edges. ?Have any leaves, snow, or ice cleared regularly. ?Use sand or salt on walking paths during winter. ?Clean up any spills in your garage right away. This includes oil or grease spills. ?What can I do in the bathroom? ?Use night lights. ?Install grab bars by the toilet and in the tub and shower. Do not use towel bars as grab bars. ?Use non-skid mats or decals in the tub or shower. ?If you need to sit down in the shower, use a plastic, non-slip stool. ?Keep the floor dry. Clean up any water that spills on the floor as soon as it happens. ?Remove soap buildup in the tub or shower regularly. ?Attach bath mats securely with double-sided  non-slip rug tape. ?Do not have throw rugs and other things on the floor that can make you trip. ?What can I do in the bedroom? ?Use night lights. ?Make sure that you have a light by your bed that is easy to reach. ?Do not use any sheets or blankets that are too big for your bed. They should not hang down onto the floor. ?Have a firm chair that has side arms. You can use this for support while you get dressed. ?Do not have throw rugs and other things on the floor that can make you trip. ?What can I do in the kitchen? ?Clean up any spills right away. ?Avoid walking on wet floors. ?Keep items that you use a lot in easy-to-reach places. ?If you need to reach something above you, use a strong step stool that has a grab bar. ?Keep electrical cords out of the way. ?Do not use floor polish or wax that makes floors slippery. If you must use wax, use non-skid floor wax. ?Do not have throw rugs and other things on the floor that can make you trip. ?What can I do with my stairs? ?Do not leave any items on the stairs. ?Make sure that there are handrails on both sides of the stairs and use them. Fix handrails that are broken or loose. Make sure that handrails are as long as the stairways. ?Check any carpeting to make sure that it is firmly attached to the  stairs. Fix any carpet that is loose or worn. ?Avoid having throw rugs at the top or bottom of the stairs. If you do have throw rugs, attach them to the floor with carpet tape. ?Make sure that you have a light switch at the top of the stairs and the bottom of the stairs. If you do not have them, ask someone to add them for you. ?What else can I do to help prevent falls? ?Wear shoes that: ?Do not have high heels. ?Have rubber bottoms. ?Are comfortable and fit you well. ?Are closed at the toe. Do not wear sandals. ?If you use a stepladder: ?Make sure that it is fully opened. Do not climb a closed stepladder. ?Make sure that both sides of the stepladder are locked into place. ?Ask  someone to hold it for you, if possible. ?Clearly mark and make sure that you can see: ?Any grab bars or handrails. ?First and last steps. ?Where the edge of each step is. ?Use tools that help you move around (mobility aids)

## 2021-11-29 ENCOUNTER — Other Ambulatory Visit: Payer: Self-pay | Admitting: Internal Medicine

## 2021-11-29 DIAGNOSIS — I10 Essential (primary) hypertension: Secondary | ICD-10-CM

## 2021-11-30 NOTE — Telephone Encounter (Signed)
Requested medications are due for refill today.  yes ? ?Requested medications are on the active medications list.  yes ? ?Last refill. 07/01/2021 #90 0 refills ? ?Future visit scheduled.   yes ? ?Notes to clinic.  Medication refill failed protocol d/t expired/ abnormal labs. ? ? ? ?Requested Prescriptions  ?Pending Prescriptions Disp Refills  ? hydrochlorothiazide (MICROZIDE) 12.5 MG capsule [Pharmacy Med Name: HYDROCHLOROTHIAZIDE 12.5 MG Capsule] 90 capsule 0  ?  Sig: TAKE 1 CAPSULE EVERY DAY  ?  ? Cardiovascular: Diuretics - Thiazide Failed - 11/29/2021  2:38 AM  ?  ?  Failed - Cr in normal range and within 180 days  ?  Creatinine, Ser  ?Date Value Ref Range Status  ?01/29/2021 0.84 0.57 - 1.00 mg/dL Final  ?  ?  ?  ?  Failed - K in normal range and within 180 days  ?  Potassium  ?Date Value Ref Range Status  ?01/29/2021 5.4 (H) 3.5 - 5.2 mmol/L Final  ?  ?  ?  ?  Failed - Na in normal range and within 180 days  ?  Sodium  ?Date Value Ref Range Status  ?01/29/2021 143 134 - 144 mmol/L Final  ?  ?  ?  ?  Passed - Last BP in normal range  ?  BP Readings from Last 1 Encounters:  ?08/02/21 138/80  ?  ?  ?  ?  Passed - Valid encounter within last 6 months  ?  Recent Outpatient Visits   ? ?      ? 4 months ago Essential (primary) hypertension  ? [facility] Glean Hess, MD  ? 10 months ago Annual physical exam  ? [facility] Glean Hess, MD  ? 1 year ago Essential (primary) hypertension  ? [facility] Glean Hess, MD  ? 1 year ago Annual physical exam  ? [facility] Glean Hess, MD  ? 2 years ago Acute otitis media, unspecified otitis media type  ? [facility] Glean Hess, MD  ? ?  ?  ?Future Appointments   ? ?        ? In 2 months Army Melia Jesse Sans, MD [facility], [place]  ? ?  ? ? ?  ?  ?  ?  ?

## 2022-01-05 ENCOUNTER — Other Ambulatory Visit: Payer: Self-pay | Admitting: Internal Medicine

## 2022-01-05 DIAGNOSIS — I10 Essential (primary) hypertension: Secondary | ICD-10-CM

## 2022-01-05 NOTE — Telephone Encounter (Signed)
Requested Prescriptions  Pending Prescriptions Disp Refills  . nebivolol (BYSTOLIC) 5 MG tablet [Pharmacy Med Name: NEBIVOLOL HYDROCHLORIDE 5 MG Tablet] 90 tablet 0    Sig: TAKE 1 TABLET EVERY DAY     Cardiovascular: Beta Blockers 3 Passed - 01/05/2022  5:12 PM      Passed - Cr in normal range and within 360 days    Creatinine, Ser  Date Value Ref Range Status  01/29/2021 0.84 0.57 - 1.00 mg/dL Final         Passed - AST in normal range and within 360 days    AST  Date Value Ref Range Status  01/29/2021 23 0 - 40 IU/L Final         Passed - ALT in normal range and within 360 days    ALT  Date Value Ref Range Status  01/29/2021 23 0 - 32 IU/L Final         Passed - Last BP in normal range    BP Readings from Last 1 Encounters:  08/02/21 138/80         Passed - Last Heart Rate in normal range    Pulse Readings from Last 1 Encounters:  08/02/21 75         Passed - Valid encounter within last 6 months    Recent Outpatient Visits          5 months ago Essential (primary) hypertension   [facility] Glean Hess, MD   11 months ago Annual physical exam   [facility] Glean Hess, MD   1 year ago Essential (primary) hypertension   [facility] Glean Hess, MD   1 year ago Annual physical exam   [facility] Glean Hess, MD   2 years ago Acute otitis media, unspecified otitis media type   [facility] Glean Hess, MD      Future Appointments            In 1 month Army Melia, Jesse Sans, MD [facility], [facility]

## 2022-02-01 ENCOUNTER — Encounter: Payer: Medicare PPO | Admitting: Internal Medicine

## 2022-02-09 ENCOUNTER — Encounter: Payer: Self-pay | Admitting: Internal Medicine

## 2022-02-09 ENCOUNTER — Ambulatory Visit (INDEPENDENT_AMBULATORY_CARE_PROVIDER_SITE_OTHER): Payer: Medicare PPO | Admitting: Internal Medicine

## 2022-02-09 VITALS — BP 142/80 | HR 65 | Ht 66.0 in | Wt 142.0 lb

## 2022-02-09 DIAGNOSIS — Z0000 Encounter for general adult medical examination without abnormal findings: Secondary | ICD-10-CM | POA: Diagnosis not present

## 2022-02-09 DIAGNOSIS — M810 Age-related osteoporosis without current pathological fracture: Secondary | ICD-10-CM | POA: Diagnosis not present

## 2022-02-09 DIAGNOSIS — K2100 Gastro-esophageal reflux disease with esophagitis, without bleeding: Secondary | ICD-10-CM

## 2022-02-09 DIAGNOSIS — E785 Hyperlipidemia, unspecified: Secondary | ICD-10-CM | POA: Diagnosis not present

## 2022-02-09 DIAGNOSIS — I10 Essential (primary) hypertension: Secondary | ICD-10-CM

## 2022-02-09 MED ORDER — NEBIVOLOL HCL 5 MG PO TABS: 5.0000 mg | ORAL_TABLET | Freq: Every day | ORAL | 1 refills | Status: DC

## 2022-02-09 MED ORDER — PRAVASTATIN SODIUM 20 MG PO TABS: 20.0000 mg | ORAL_TABLET | Freq: Every day | ORAL | 1 refills | Status: DC

## 2022-02-09 MED ORDER — HYDROCHLOROTHIAZIDE 12.5 MG PO CAPS: 12.5000 mg | ORAL_CAPSULE | Freq: Every day | ORAL | 1 refills | Status: DC

## 2022-02-09 MED ORDER — VALSARTAN 320 MG PO TABS: 320.0000 mg | ORAL_TABLET | Freq: Every day | ORAL | 1 refills | Status: DC

## 2022-02-09 NOTE — Progress Notes (Signed)
Date:  02/09/2022   Name:  Sharon Ayers   DOB:  1951-05-10   MRN:  [id number]   Chief Complaint: Annual Exam (Breast exam no pap ) Sharon Ayers is a 71 y.o. female who presents today for her Complete Annual Exam. She feels well. She reports exercising. She reports she is sleeping well.   Mammogram: discontinued DEXA: 02/2020 osteopenia Pap smear: 05/2018 GYN Colonoscopy: 02/2018 repeat 10 yrs  There are no preventive care reminders to display for this patient.   Immunization History  Administered Date(s) Administered   Fluad Quad(high Dose 65+) 04/22/2019, 05/15/2020   Influenza,inj,Quad PF,6+ Mos 06/02/2017, 05/26/2018   Influenza-Unspecified 05/17/2021   Moderna SARS-COV2 Booster Vaccination 12/04/2020, 07/23/2021   Moderna Sars-Covid-2 Vaccination 08/20/2019, 09/17/2019, 06/12/2020   Pneumococcal Conjugate-13 08/14/2018   Pneumococcal Polysaccharide-23 11/25/2019   Tdap 11/01/2012   Zoster Recombinat (Shingrix) 07/08/2017, 11/06/2017    Hypertension This is a chronic problem. The problem is controlled. Pertinent negatives include no chest pain, headaches, palpitations or shortness of breath. Past treatments include diuretics, angiotensin blockers and beta blockers. There is no history of kidney disease, CAD/MI or CVA.  Hyperlipidemia This is a chronic problem. Pertinent negatives include no chest pain or shortness of breath. Current antihyperlipidemic treatment includes statins.  Gastroesophageal Reflux She complains of heartburn. She reports no abdominal pain, no chest pain, no coughing or no wheezing. This is a recurrent problem. The problem occurs occasionally. Pertinent negatives include no fatigue. She has tried a PPI for the symptoms.    Lab Results  Component Value Date   NA 143 01/29/2021   K 5.4 (H) 01/29/2021   CO2 22 01/29/2021   GLUCOSE 106 (H) 01/29/2021   BUN 15 01/29/2021   CREATININE 0.84 01/29/2021   CALCIUM 10.2 01/29/2021   EGFR 76  01/29/2021   GFRNONAA 85 01/24/2020   Lab Results  Component Value Date   CHOL 274 (H) 01/29/2021   HDL 139 01/29/2021   LDLCALC 125 (H) 01/29/2021   TRIG 65 01/29/2021   CHOLHDL 2.0 01/29/2021   Lab Results  Component Value Date   TSH 1.560 01/29/2021   No results found for: "HGBA1C" Lab Results  Component Value Date   WBC 8.4 01/29/2021   HGB 14.2 01/29/2021   HCT 42.0 01/29/2021   MCV 94 01/29/2021   PLT 266 01/29/2021   Lab Results  Component Value Date   ALT 23 01/29/2021   AST 23 01/29/2021   ALKPHOS 82 01/29/2021   BILITOT 0.4 01/29/2021   No results found for: "25OHVITD2", "25OHVITD3", "VD25OH"   Review of Systems  Constitutional:  Negative for chills, fatigue and fever.  HENT:  Negative for congestion, hearing loss, tinnitus, trouble swallowing and voice change.   Eyes:  Negative for visual disturbance.  Respiratory:  Negative for cough, chest tightness, shortness of breath and wheezing.   Cardiovascular:  Negative for chest pain, palpitations and leg swelling.  Gastrointestinal:  Positive for heartburn. Negative for abdominal pain, constipation, diarrhea and vomiting.       Gerd treated with zegerid OTC  Endocrine: Negative for polydipsia and polyuria.  Genitourinary:  Negative for dysuria, frequency, genital sores, vaginal bleeding and vaginal discharge.  Musculoskeletal:  Negative for arthralgias, gait problem and joint swelling.  Skin:  Negative for color change and rash.  Neurological:  Negative for dizziness, tremors, light-headedness and headaches.  Hematological:  Negative for adenopathy. Does not bruise/bleed easily.  Psychiatric/Behavioral:  Negative for dysphoric mood and sleep disturbance. The patient  is not nervous/anxious.     Patient Active Problem List   Diagnosis Date Noted   Collagenous colitis 07/25/2017   Dyslipidemia 05/27/2015   Essential (primary) hypertension 05/27/2015   Generalized OA 05/27/2015   Esophagitis, reflux  05/27/2015   OP (osteoporosis) 05/27/2015   History of right breast cancer 03/12/2014   Abnormal Pap smear of cervix 03/26/2013   Carotid artery narrowing 11/27/2012    Allergies  Allergen Reactions   Diclofenac Diarrhea and Nausea And Vomiting   Ace Inhibitors Cough and Other (See Comments)    Other reaction(s): Cough   Atorvastatin Other (See Comments)    Severe back pain Severe back pain   Olmesartan Diarrhea    Past Surgical History:  Procedure Laterality Date   BILATERAL TOTAL MASTECTOMY WITH AXILLARY LYMPH NODE DISSECTION  05/2014   DCIS right breast ER/PR-   CAROTID ENDARTERECTOMY  2009   COLONOSCOPY  2008    Social History   Tobacco Use   Smoking status: Never   Smokeless tobacco: Never  Vaping Use   Vaping Use: Never used  Substance Use Topics   Alcohol use: Yes    Alcohol/week: 2.0 standard drinks of alcohol    Types: 2 Standard drinks or equivalent per week   Drug use: Never     Medication list has been reviewed and updated.  Current Meds  Medication Sig   Ascorbic Acid (VITAMIN C) 1000 MG tablet Take 1,000 mg by mouth daily.   aspirin 325 MG tablet Take 1 tablet by mouth daily.   calcium carbonate (OSCAL) 1500 (600 Ca) MG TABS tablet Take by mouth 2 (two) times daily with a meal.   ELDERBERRY PO Take by mouth in the morning and at bedtime.   MULTIPLE VITAMIN PO Take by mouth.   Omega-3 Fatty Acids (FISH OIL BURP-LESS) 1000 MG CAPS Take 1 capsule by mouth daily.   Omeprazole-Sodium Bicarbonate (ZEGERID OTC) 20-1100 MG CAPS capsule Take 1 capsule by mouth daily.   [DISCONTINUED] hydrochlorothiazide (MICROZIDE) 12.5 MG capsule TAKE 1 CAPSULE EVERY DAY   [DISCONTINUED] nebivolol (BYSTOLIC) 5 MG tablet TAKE 1 TABLET EVERY DAY   [DISCONTINUED] pravastatin (PRAVACHOL) 20 MG tablet Take 1 tablet (20 mg total) by mouth daily.   [DISCONTINUED] valsartan (DIOVAN) 320 MG tablet Take 1 tablet (320 mg total) by mouth daily.       02/09/2022    9:57 AM 08/02/2021     8:10 AM 01/29/2021    8:36 AM 07/06/2020   10:54 AM  GAD 7 : Generalized Anxiety Score  Nervous, Anxious, on Edge 0 0 0 0  Control/stop worrying 0 0 0 0  Worry too much - different things 0 0 0 0  Trouble relaxing 0 0 0 0  Restless 0 0 0 0  Easily annoyed or irritable 0 0 0 0  Afraid - awful might happen 0 0 0 0  Total GAD 7 Score 0 0 0 0  Anxiety Difficulty Not difficult at all  Not difficult at all        02/09/2022    9:56 AM 11/08/2021   10:56 AM 08/02/2021    8:10 AM  Depression screen PHQ 2/9  Decreased Interest 0 0 0  Down, Depressed, Hopeless 0 0 0  PHQ - 2 Score 0 0 0  Altered sleeping 0  0  Tired, decreased energy 0  0  Change in appetite 0  0  Feeling bad or failure about yourself  0  0  Trouble concentrating 0  0  Moving slowly or fidgety/restless 0  0  Suicidal thoughts 0  0  PHQ-9 Score 0  0  Difficult doing work/chores Not difficult at all      BP Readings from Last 3 Encounters:  02/09/22 (!) 142/80  08/02/21 138/80  01/29/21 127/76    Physical Exam Vitals and nursing note reviewed.  Constitutional:      General: She is not in acute distress.    Appearance: She is well-developed.  HENT:     Head: Normocephalic and atraumatic.     Right Ear: Tympanic membrane and ear canal normal.     Left Ear: Tympanic membrane and ear canal normal.     Nose:     Right Sinus: No maxillary sinus tenderness.     Left Sinus: No maxillary sinus tenderness.  Eyes:     General: No scleral icterus.       Right eye: No discharge.        Left eye: No discharge.     Conjunctiva/sclera: Conjunctivae normal.  Neck:     Thyroid: No thyromegaly.     Vascular: No carotid bruit.  Cardiovascular:     Rate and Rhythm: Normal rate and regular rhythm.     Pulses: Normal pulses.     Heart sounds: Normal heart sounds.  Pulmonary:     Effort: Pulmonary effort is normal. No respiratory distress.     Breath sounds: No wheezing.  Chest:  Breasts:    Right: Absent.     Left:  Absent. No skin change.  Abdominal:     General: Bowel sounds are normal.     Palpations: Abdomen is soft.     Tenderness: There is no abdominal tenderness.  Musculoskeletal:     Cervical back: Normal range of motion. No erythema.     Right lower leg: No edema.     Left lower leg: No edema.  Lymphadenopathy:     Cervical: No cervical adenopathy.  Skin:    General: Skin is warm and dry.     Findings: No rash.  Neurological:     Mental Status: She is alert and oriented to person, place, and time.     Cranial Nerves: No cranial nerve deficit.     Sensory: No sensory deficit.     Deep Tendon Reflexes: Reflexes are normal and symmetric.  Psychiatric:        Attention and Perception: Attention normal.        Mood and Affect: Mood normal.     Wt Readings from Last 3 Encounters:  02/09/22 142 lb (64.4 kg)  08/02/21 140 lb (63.5 kg)  01/29/21 139 lb (63 kg)    BP (!) 142/80   Pulse 65   Ht '5\' 6"'  (1.676 m)   Wt 142 lb (64.4 kg)   SpO2 93%   BMI 22.92 kg/m   Assessment and Plan: 1. Annual physical exam Normal exam Up to date on screenings and immunizations.  2. Essential (primary) hypertension BP control is good at home - has a component of [facility] so will continue same medications Continue exercise and low sodium diet - Comprehensive metabolic panel - TSH - Urinalysis, Routine w reflex microscopic - hydrochlorothiazide (MICROZIDE) 12.5 MG capsule; Take 1 capsule (12.5 mg total) by mouth daily.  Dispense: 90 capsule; Refill: 1 - nebivolol (BYSTOLIC) 5 MG tablet; Take 1 tablet (5 mg total) by mouth daily.  Dispense: 90 tablet; Refill: 1 - valsartan (DIOVAN) 320 MG tablet; Take 1 tablet (320  mg total) by mouth daily.  Dispense: 90 tablet; Refill: 1  3. Dyslipidemia Tolerating statin medication without side effects at this time LDL is at goal of < 70 on current dose Continue same therapy without change at this time. - Lipid panel - pravastatin (PRAVACHOL) 20 MG tablet; Take 1  tablet (20 mg total) by mouth daily.  Dispense: 90 tablet; Refill: 1  4. Gastroesophageal reflux disease with esophagitis without hemorrhage Symptoms well controlled on daily Zegerid No red flag signs such as weight loss, n/v, melena Will continue. - CBC with Differential/Platelet  5. Age-related osteoporosis without current pathological fracture Recommend repeat DEXA next year Continue calcium and vitamin D - VITAMIN D 25 Hydroxy (Vit-D Deficiency, Fractures)   Partially dictated using Editor, commissioning. Any errors are unintentional.  Halina Maidens, MD [facility]  02/09/2022

## 2022-02-10 LAB — LIPID PANEL
Chol/HDL Ratio: 2 ratio (ref 0.0–4.4)
Cholesterol, Total: 260 mg/dL — ABNORMAL HIGH (ref 100–199)
HDL: 130 mg/dL (ref >39)
LDL Chol Calc (NIH): 124 mg/dL — ABNORMAL HIGH (ref 0–99)
Triglycerides: 39 mg/dL (ref 0–149)
VLDL Cholesterol Cal: 6 mg/dL (ref 5–40)

## 2022-02-10 LAB — URINALYSIS, ROUTINE W REFLEX MICROSCOPIC
Bilirubin, UA: NEGATIVE
Glucose, UA: NEGATIVE
Leukocytes,UA: NEGATIVE
Nitrite, UA: NEGATIVE
Protein,UA: NEGATIVE
RBC, UA: NEGATIVE
Specific Gravity, UA: 1.009 (ref 1.005–1.030)
Urobilinogen, Ur: 0.2 mg/dL (ref 0.2–1.0)
pH, UA: 6 (ref 5.0–7.5)

## 2022-02-10 LAB — CBC WITH DIFFERENTIAL/PLATELET
Basophils Absolute: 0 10*3/uL (ref 0.0–0.2)
Basos: 1 %
EOS (ABSOLUTE): 0.1 10*3/uL (ref 0.0–0.4)
Eos: 1 %
Hematocrit: 40.7 % (ref 34.0–46.6)
Hemoglobin: 13.9 g/dL (ref 11.1–15.9)
Immature Grans (Abs): 0 10*3/uL (ref 0.0–0.1)
Immature Granulocytes: 0 %
Lymphocytes Absolute: 2.3 10*3/uL (ref 0.7–3.1)
Lymphs: 28 %
MCH: 32.2 pg (ref 26.6–33.0)
MCHC: 34.2 g/dL (ref 31.5–35.7)
MCV: 94 fL (ref 79–97)
Monocytes Absolute: 0.6 10*3/uL (ref 0.1–0.9)
Monocytes: 7 %
Neutrophils Absolute: 5.4 10*3/uL (ref 1.4–7.0)
Neutrophils: 63 %
Platelets: 238 10*3/uL (ref 150–450)
RBC: 4.32 x10E6/uL (ref 3.77–5.28)
RDW: 12.1 % (ref 11.7–15.4)
WBC: 8.4 10*3/uL (ref 3.4–10.8)

## 2022-02-10 LAB — VITAMIN D 25 HYDROXY (VIT D DEFICIENCY, FRACTURES): Vit D, 25-Hydroxy: 69.3 ng/mL (ref 30.0–100.0)

## 2022-02-10 LAB — COMPREHENSIVE METABOLIC PANEL
ALT: 21 IU/L (ref 0–32)
AST: 24 IU/L (ref 0–40)
Albumin/Globulin Ratio: 2.4 — ABNORMAL HIGH (ref 1.2–2.2)
Albumin: 5 g/dL — ABNORMAL HIGH (ref 3.9–4.9)
Alkaline Phosphatase: 82 IU/L (ref 44–121)
BUN/Creatinine Ratio: 19 (ref 12–28)
BUN: 15 mg/dL (ref 8–27)
Bilirubin Total: 0.4 mg/dL (ref 0.0–1.2)
CO2: 22 mmol/L (ref 20–29)
Calcium: 10 mg/dL (ref 8.7–10.3)
Chloride: 99 mmol/L (ref 96–106)
Creatinine, Ser: 0.81 mg/dL (ref 0.57–1.00)
Globulin, Total: 2.1 g/dL (ref 1.5–4.5)
Glucose: 94 mg/dL (ref 70–99)
Potassium: 4.3 mmol/L (ref 3.5–5.2)
Sodium: 139 mmol/L (ref 134–144)
Total Protein: 7.1 g/dL (ref 6.0–8.5)
eGFR: 79 mL/min/{1.73_m2} (ref >59)

## 2022-02-10 LAB — TSH: TSH: 0.874 u[IU]/mL (ref 0.450–4.500)

## 2022-02-16 ENCOUNTER — Telehealth: Payer: Self-pay | Admitting: Internal Medicine

## 2022-02-16 NOTE — Telephone Encounter (Signed)
Printed and mailed

## 2022-02-16 NOTE — Telephone Encounter (Signed)
Copied from [place] [id number]. Topic: General - Inquiry >> Feb 16, 2022 10:56 AM Tiffany B wrote: Reason for CRM: Patient would like most recent labs mailed to home   [place] [phone]

## 2022-07-06 ENCOUNTER — Other Ambulatory Visit: Payer: Self-pay | Admitting: Internal Medicine

## 2022-07-06 DIAGNOSIS — I10 Essential (primary) hypertension: Secondary | ICD-10-CM

## 2022-07-06 NOTE — Telephone Encounter (Signed)
Future in 1 month. Requested Prescriptions  Pending Prescriptions Disp Refills   hydrochlorothiazide (MICROZIDE) 12.5 MG capsule [Pharmacy Med Name: HYDROCHLOROTHIAZIDE 12.5 MG Capsule] 90 capsule 0    Sig: TAKE 1 CAPSULE (12.5 MG TOTAL) BY MOUTH DAILY.     Cardiovascular: Diuretics - Thiazide Failed - 07/06/2022  3:22 AM      Failed - Last BP in normal range    BP Readings from Last 1 Encounters:  02/09/22 (!) 142/80         Passed - Cr in normal range and within 180 days    Creatinine, Ser  Date Value Ref Range Status  02/09/2022 0.81 0.57 - 1.00 mg/dL Final         Passed - K in normal range and within 180 days    Potassium  Date Value Ref Range Status  02/09/2022 4.3 3.5 - 5.2 mmol/L Final         Passed - Na in normal range and within 180 days    Sodium  Date Value Ref Range Status  02/09/2022 139 134 - 144 mmol/L Final         Passed - Valid encounter within last 6 months    Recent Outpatient Visits           4 months ago Annual physical exam   [place] [facility], Jesse Sans, MD   11 months ago Essential (primary) hypertension   [place] [facility] at [facility], Jesse Sans, MD   1 year ago Annual physical exam   [place] [facility] at [facility], Jesse Sans, MD   2 years ago Essential (primary) hypertension   [place] [facility] at [facility], Jesse Sans, MD   2 years ago Annual physical exam   [facility] at [facility], Jesse Sans, MD       Future Appointments             In 1 month Army Melia, Jesse Sans, MD [facility] at [facility], [facility]   In 7 months Army Melia, Jesse Sans, MD [place] [facility] at [facility], [facility]

## 2022-07-13 ENCOUNTER — Other Ambulatory Visit: Payer: Self-pay | Admitting: Internal Medicine

## 2022-07-13 DIAGNOSIS — I10 Essential (primary) hypertension: Secondary | ICD-10-CM

## 2022-07-13 DIAGNOSIS — E785 Hyperlipidemia, unspecified: Secondary | ICD-10-CM

## 2022-07-14 NOTE — Telephone Encounter (Signed)
Requested Prescriptions  Pending Prescriptions Disp Refills   pravastatin (PRAVACHOL) 20 MG tablet [Pharmacy Med Name: PRAVASTATIN SODIUM 20 MG Tablet] 90 tablet 1    Sig: TAKE 1 TABLET (20 MG TOTAL) BY MOUTH DAILY.     Cardiovascular:  Antilipid - Statins Failed - 07/13/2022  9:30 PM      Failed - Lipid Panel in normal range within the last 12 months    Cholesterol, Total  Date Value Ref Range Status  02/09/2022 260 (H) 100 - 199 mg/dL Final   LDL Chol Calc (NIH)  Date Value Ref Range Status  02/09/2022 124 (H) 0 - 99 mg/dL Final   HDL  Date Value Ref Range Status  02/09/2022 130 >39 mg/dL Final   Triglycerides  Date Value Ref Range Status  02/09/2022 39 0 - 149 mg/dL Final         Passed - Patient is not pregnant      Passed - Valid encounter within last 12 months    Recent Outpatient Visits           5 months ago Annual physical exam   [place] [facility] at [facility], Jesse Sans, MD   11 months ago Essential (primary) hypertension   [place] [facility] at [facility], Jesse Sans, MD   1 year ago Annual physical exam   [place] [facility] at [facility], Jesse Sans, MD   2 years ago Essential (primary) hypertension   [place] [facility] at [facility], Jesse Sans, MD   2 years ago Annual physical exam   [place] [facility] at [facility], Jesse Sans, MD       Future Appointments             In 4 weeks Army Melia Jesse Sans, MD [facility] at [facility], [facility]   In 7 months Army Melia Jesse Sans, MD [facility] Primary Care and Sports Medicine at [place], [facility]             valsartan (DIOVAN) 320 MG tablet [Pharmacy Med Name: VALSARTAN 320 MG Tablet] 90 tablet 0    Sig: TAKE 1 TABLET (320 MG TOTAL) BY MOUTH DAILY.     Cardiovascular:   Angiotensin Receptor Blockers Failed - 07/13/2022  9:30 PM      Failed - Last BP in normal range    BP Readings from Last 1 Encounters:  02/09/22 (!) 142/80         Passed - Cr in normal range and within 180 days    Creatinine, Ser  Date Value Ref Range Status  02/09/2022 0.81 0.57 - 1.00 mg/dL Final         Passed - K in normal range and within 180 days    Potassium  Date Value Ref Range Status  02/09/2022 4.3 3.5 - 5.2 mmol/L Final         Passed - Patient is not pregnant      Passed - Valid encounter within last 6 months    Recent Outpatient Visits           5 months ago Annual physical exam   [place] [facility] at [facility], Jesse Sans, MD   11 months ago Essential (primary) hypertension   [place] [facility] at [facility]  Robert Bellow, MD   1 year ago Annual physical exam   [place] [facility] at [facility], Jesse Sans, MD   2 years ago Essential (primary) hypertension   [place] [facility] at [facility], Jesse Sans, MD   2 years ago Annual physical exam   [facility] and [facility] Medicine at [facility], Jesse Sans, MD       Future Appointments             In 4 weeks Army Melia Jesse Sans, MD [facility] Primary Care and Sports Medicine at [facility], [facility]   In 7 months Army Melia, Jesse Sans, MD [place] [facility] at [facility], [facility]

## 2022-08-12 ENCOUNTER — Ambulatory Visit: Payer: Medicare PPO | Admitting: Internal Medicine

## 2022-08-15 ENCOUNTER — Other Ambulatory Visit: Payer: Self-pay | Admitting: Internal Medicine

## 2022-08-15 DIAGNOSIS — I10 Essential (primary) hypertension: Secondary | ICD-10-CM

## 2022-09-12 ENCOUNTER — Ambulatory Visit: Payer: Medicare PPO | Admitting: Internal Medicine

## 2022-09-12 ENCOUNTER — Encounter: Payer: Self-pay | Admitting: Internal Medicine

## 2022-09-12 VITALS — BP 136/72 | HR 68 | Ht 66.0 in | Wt 137.0 lb

## 2022-09-12 DIAGNOSIS — I10 Essential (primary) hypertension: Secondary | ICD-10-CM

## 2022-09-12 NOTE — Progress Notes (Signed)
Date:  09/12/2022   Name:  Sharon Ayers   DOB:  [birth year]   MRN:  [id number]   Chief Complaint: Hypertension  Hypertension This is a chronic problem. The problem is controlled. Pertinent negatives include no chest pain, headaches, palpitations or shortness of breath. Past treatments include beta blockers, diuretics and angiotensin blockers. The current treatment provides significant improvement.    Lab Results  Component Value Date   NA 139 02/09/2022   K 4.3 02/09/2022   CO2 22 02/09/2022   GLUCOSE 94 02/09/2022   BUN 15 02/09/2022   CREATININE 0.81 02/09/2022   CALCIUM 10.0 02/09/2022   EGFR 79 02/09/2022   GFRNONAA 85 01/24/2020   Lab Results  Component Value Date   CHOL 260 (H) 02/09/2022   HDL 130 02/09/2022   LDLCALC 124 (H) 02/09/2022   TRIG 39 02/09/2022   CHOLHDL 2.0 02/09/2022   Lab Results  Component Value Date   TSH 0.874 02/09/2022   No results found for: "HGBA1C" Lab Results  Component Value Date   WBC 8.4 02/09/2022   HGB 13.9 02/09/2022   HCT 40.7 02/09/2022   MCV 94 02/09/2022   PLT 238 02/09/2022   Lab Results  Component Value Date   ALT 21 02/09/2022   AST 24 02/09/2022   ALKPHOS 82 02/09/2022   BILITOT 0.4 02/09/2022   Lab Results  Component Value Date   VD25OH 69.3 02/09/2022     Review of Systems  Constitutional:  Negative for fatigue and unexpected weight change.  HENT:  Negative for nosebleeds.   Eyes:  Negative for visual disturbance.  Respiratory:  Negative for cough, chest tightness, shortness of breath and wheezing.   Cardiovascular:  Negative for chest pain, palpitations and leg swelling.  Gastrointestinal:  Negative for abdominal pain, constipation and diarrhea.  Musculoskeletal:  Negative for arthralgias.  Neurological:  Negative for dizziness, weakness, light-headedness and headaches.  Psychiatric/Behavioral:  Negative for dysphoric mood and sleep disturbance. The patient is not nervous/anxious.     Patient  Active Problem List   Diagnosis Date Noted   Collagenous colitis 07/25/2017   Dyslipidemia 05/27/2015   Essential (primary) hypertension 05/27/2015   Generalized OA 05/27/2015   Esophagitis, reflux 05/27/2015   OP (osteoporosis) 05/27/2015   History of right breast cancer 03/12/2014   Abnormal Pap smear of cervix 03/26/2013   Carotid artery narrowing 11/27/2012    Allergies  Allergen Reactions   Diclofenac Diarrhea and Nausea And Vomiting   Ace Inhibitors Cough and Other (See Comments)    Other reaction(s): Cough   Atorvastatin Other (See Comments)    Severe back pain Severe back pain   Olmesartan Diarrhea    Past Surgical History:  Procedure Laterality Date   BILATERAL TOTAL MASTECTOMY WITH AXILLARY LYMPH NODE DISSECTION  05/2014   DCIS right breast ER/PR-   CAROTID ENDARTERECTOMY  2009   COLONOSCOPY  2008    Social History   Tobacco Use   Smoking status: Never   Smokeless tobacco: Never  Vaping Use   Vaping Use: Never used  Substance Use Topics   Alcohol use: Yes    Alcohol/week: 2.0 standard drinks of alcohol    Types: 2 Standard drinks or equivalent per week   Drug use: Never     Medication list has been reviewed and updated.  Current Meds  Medication Sig   Ascorbic Acid (VITAMIN C) 1000 MG tablet Take 1,000 mg by mouth daily.   aspirin 325 MG tablet Take 1 tablet  by mouth daily.   calcium carbonate (OSCAL) 1500 (600 Ca) MG TABS tablet Take by mouth 2 (two) times daily with a meal.   ELDERBERRY PO Take by mouth in the morning and at bedtime.   hydrochlorothiazide (MICROZIDE) 12.5 MG capsule TAKE 1 CAPSULE (12.5 MG TOTAL) BY MOUTH DAILY.   MULTIPLE VITAMIN PO Take by mouth.   nebivolol (BYSTOLIC) 5 MG tablet TAKE 1 TABLET EVERY DAY   Omega-3 Fatty Acids (FISH OIL BURP-LESS) 1000 MG CAPS Take 1 capsule by mouth daily.   Omeprazole-Sodium Bicarbonate (ZEGERID OTC) 20-1100 MG CAPS capsule Take 1 capsule by mouth daily.   pravastatin (PRAVACHOL) 20 MG tablet  TAKE 1 TABLET (20 MG TOTAL) BY MOUTH DAILY.   valsartan (DIOVAN) 320 MG tablet TAKE 1 TABLET (320 MG TOTAL) BY MOUTH DAILY.       02/09/2022    9:57 AM 08/02/2021    8:10 AM 01/29/2021    8:36 AM 07/06/2020   10:54 AM  GAD 7 : Generalized Anxiety Score  Nervous, Anxious, on Edge 0 0 0 0  Control/stop worrying 0 0 0 0  Worry too much - different things 0 0 0 0  Trouble relaxing 0 0 0 0  Restless 0 0 0 0  Easily annoyed or irritable 0 0 0 0  Afraid - awful might happen 0 0 0 0  Total GAD 7 Score 0 0 0 0  Anxiety Difficulty Not difficult at all  Not difficult at all        02/09/2022    9:56 AM 11/08/2021   10:56 AM 08/02/2021    8:10 AM  Depression screen PHQ 2/9  Decreased Interest 0 0 0  Down, Depressed, Hopeless 0 0 0  PHQ - 2 Score 0 0 0  Altered sleeping 0  0  Tired, decreased energy 0  0  Change in appetite 0  0  Feeling bad or failure about yourself  0  0  Trouble concentrating 0  0  Moving slowly or fidgety/restless 0  0  Suicidal thoughts 0  0  PHQ-9 Score 0  0  Difficult doing work/chores Not difficult at all      BP Readings from Last 3 Encounters:  09/12/22 136/72  02/09/22 (!) 142/80  08/02/21 138/80    Physical Exam Vitals and nursing note reviewed.  Constitutional:      General: She is not in acute distress.    Appearance: She is well-developed.  HENT:     Head: Normocephalic and atraumatic.  Cardiovascular:     Rate and Rhythm: Normal rate and regular rhythm.     Pulses: Normal pulses.     Heart sounds: No murmur heard. Pulmonary:     Effort: Pulmonary effort is normal. No respiratory distress.     Breath sounds: No wheezing or rhonchi.  Musculoskeletal:     Cervical back: Normal range of motion.     Right lower leg: No edema.     Left lower leg: No edema.  Lymphadenopathy:     Cervical: No cervical adenopathy.  Skin:    General: Skin is warm and dry.     Capillary Refill: Capillary refill takes less than 2 seconds.     Findings: No rash.   Neurological:     General: No focal deficit present.     Mental Status: She is alert and oriented to person, place, and time.  Psychiatric:        Mood and Affect: Mood normal.  Behavior: Behavior normal.     Wt Readings from Last 3 Encounters:  09/12/22 137 lb (62.1 kg)  02/09/22 142 lb (64.4 kg)  08/02/21 140 lb (63.5 kg)    BP 136/72 (BP Location: Left Arm, Cuff Size: Normal)   Pulse 68   Ht 5' 6"$  (1.676 m)   Wt 137 lb (62.1 kg)   SpO2 98%   BMI 22.11 kg/m   Assessment and Plan: Problem List Items Addressed This Visit       Cardiovascular and Mediastinum   Essential (primary) hypertension - Primary (Chronic)    Clinically stable exam with well controlled BP on bystolic, hctz and valsartan. Tolerating medications without side effects. Pt to continue current regimen and low sodium diet.         Partially dictated using Editor, commissioning. Any errors are unintentional.  Halina Maidens, MD [facility]  09/12/2022

## 2022-09-12 NOTE — Assessment & Plan Note (Signed)
Clinically stable exam with well controlled BP on bystolic, hctz and valsartan. Tolerating medications without side effects. Pt to continue current regimen and low sodium diet.

## 2022-09-17 ENCOUNTER — Other Ambulatory Visit: Payer: Self-pay | Admitting: Internal Medicine

## 2022-09-17 DIAGNOSIS — I10 Essential (primary) hypertension: Secondary | ICD-10-CM

## 2022-09-28 ENCOUNTER — Other Ambulatory Visit: Payer: Self-pay | Admitting: Internal Medicine

## 2022-09-28 DIAGNOSIS — I10 Essential (primary) hypertension: Secondary | ICD-10-CM

## 2022-10-12 DIAGNOSIS — H6983 Other specified disorders of Eustachian tube, bilateral: Secondary | ICD-10-CM | POA: Diagnosis not present

## 2022-10-12 DIAGNOSIS — H6123 Impacted cerumen, bilateral: Secondary | ICD-10-CM | POA: Diagnosis not present

## 2022-10-12 DIAGNOSIS — H903 Sensorineural hearing loss, bilateral: Secondary | ICD-10-CM | POA: Diagnosis not present

## 2022-11-16 ENCOUNTER — Ambulatory Visit (INDEPENDENT_AMBULATORY_CARE_PROVIDER_SITE_OTHER): Payer: Medicare PPO

## 2022-11-16 VITALS — Ht 66.0 in | Wt 137.0 lb

## 2022-11-16 DIAGNOSIS — Z0000 Encounter for general adult medical examination without abnormal findings: Secondary | ICD-10-CM

## 2022-11-16 NOTE — Patient Instructions (Signed)
Ms. Sharon Ayers , Thank you for taking time to come for your Medicare Wellness Visit. I appreciate your ongoing commitment to your health goals. Please review the following plan we discussed and let me know if I can assist you in the future.   These are the goals we discussed:  Goals      DIET - EAT MORE FRUITS AND VEGETABLES     Patient Stated     Patient states she would like to stay healthy and maintain weight.         This is a list of the screening recommended for you and due dates:  Health Maintenance  Topic Date Due   COVID-19 Vaccine (6 - 2023-24 season) 03/25/2022   DTaP/Tdap/Td vaccine (2 - Td or Tdap) 11/02/2022   Flu Shot  02/23/2023   Medicare Annual Wellness Visit  11/16/2023   Colon Cancer Screening  03/20/2028   Pneumonia Vaccine  Completed   DEXA scan (bone density measurement)  Completed   Zoster (Shingles) Vaccine  Completed   Hepatitis C Screening: USPSTF Recommendation to screen - Ages 71-79 yo.  Addressed   HPV Vaccine  Aged Out    Advanced directives: no  Conditions/risks identified: none  Next appointment: Follow up in one year for your annual wellness visit 11/22/23 @ 8:45 am by phone   Preventive Care 65 Years and Older, Female Preventive care refers to lifestyle choices and visits with your health care provider that can promote health and wellness. What does preventive care include? A yearly physical exam. This is also called an annual well check. Dental exams once or twice a year. Routine eye exams. Ask your health care provider how often you should have your eyes checked. Personal lifestyle choices, including: Daily care of your teeth and gums. Regular physical activity. Eating a healthy diet. Avoiding tobacco and drug use. Limiting alcohol use. Practicing safe sex. Taking low-dose aspirin every day. Taking vitamin and mineral supplements as recommended by your health care provider. What happens during an annual well check? The services and  screenings done by your health care provider during your annual well check will depend on your age, overall health, lifestyle risk factors, and family history of disease. Counseling  Your health care provider may ask you questions about your: Alcohol use. Tobacco use. Drug use. Emotional well-being. Home and relationship well-being. Sexual activity. Eating habits. History of falls. Memory and ability to understand (cognition). Work and work Astronomer. Reproductive health. Screening  You may have the following tests or measurements: Height, weight, and BMI. Blood pressure. Lipid and cholesterol levels. These may be checked every 5 years, or more frequently if you are over 20 years old. Skin check. Lung cancer screening. You may have this screening every year starting at age 36 if you have a 30-pack-year history of smoking and currently smoke or have quit within the past 15 years. Fecal occult blood test (FOBT) of the stool. You may have this test every year starting at age 18. Flexible sigmoidoscopy or colonoscopy. You may have a sigmoidoscopy every 5 years or a colonoscopy every 10 years starting at age 83. Hepatitis C blood test. Hepatitis B blood test. Sexually transmitted disease (STD) testing. Diabetes screening. This is done by checking your blood sugar (glucose) after you have not eaten for a while (fasting). You may have this done every 1-3 years. Bone density scan. This is done to screen for osteoporosis. You may have this done starting at age 27. Mammogram. This may be done  every 1-2 years. Talk to your health care provider about how often you should have regular mammograms. Talk with your health care provider about your test results, treatment options, and if necessary, the need for more tests. Vaccines  Your health care provider may recommend certain vaccines, such as: Influenza vaccine. This is recommended every year. Tetanus, diphtheria, and acellular pertussis (Tdap,  Td) vaccine. You may need a Td booster every 10 years. Zoster vaccine. You may need this after age 54. Pneumococcal 13-valent conjugate (PCV13) vaccine. One dose is recommended after age 47. Pneumococcal polysaccharide (PPSV23) vaccine. One dose is recommended after age 110. Talk to your health care provider about which screenings and vaccines you need and how often you need them. This information is not intended to replace advice given to you by your health care provider. Make sure you discuss any questions you have with your health care provider. Document Released: 08/07/2015 Document Revised: 03/30/2016 Document Reviewed: 05/12/2015 Elsevier Interactive Patient Education  2017 [employer] Prevention in the Home Falls can cause injuries. They can happen to people of all ages. There are many things you can do to make your home safe and to help prevent falls. What can I do on the outside of my home? Regularly fix the edges of walkways and driveways and fix any cracks. Remove anything that might make you trip as you walk through a door, such as a raised step or threshold. Trim any bushes or trees on the path to your home. Use bright outdoor lighting. Clear any walking paths of anything that might make someone trip, such as rocks or tools. Regularly check to see if handrails are loose or broken. Make sure that both sides of any steps have handrails. Any raised decks and porches should have guardrails on the edges. Have any leaves, snow, or ice cleared regularly. Use sand or salt on walking paths during winter. Clean up any spills in your garage right away. This includes oil or grease spills. What can I do in the bathroom? Use night lights. Install grab bars by the toilet and in the tub and shower. Do not use towel bars as grab bars. Use non-skid mats or decals in the tub or shower. If you need to sit down in the shower, use a plastic, non-slip stool. Keep the floor dry. Clean up any  water that spills on the floor as soon as it happens. Remove soap buildup in the tub or shower regularly. Attach bath mats securely with double-sided non-slip rug tape. Do not have throw rugs and other things on the floor that can make you trip. What can I do in the bedroom? Use night lights. Make sure that you have a light by your bed that is easy to reach. Do not use any sheets or blankets that are too big for your bed. They should not hang down onto the floor. Have a firm chair that has side arms. You can use this for support while you get dressed. Do not have throw rugs and other things on the floor that can make you trip. What can I do in the kitchen? Clean up any spills right away. Avoid walking on wet floors. Keep items that you use a lot in easy-to-reach places. If you need to reach something above you, use a strong step stool that has a grab bar. Keep electrical cords out of the way. Do not use floor polish or wax that makes floors slippery. If you must use wax, use  non-skid floor wax. Do not have throw rugs and other things on the floor that can make you trip. What can I do with my stairs? Do not leave any items on the stairs. Make sure that there are handrails on both sides of the stairs and use them. Fix handrails that are broken or loose. Make sure that handrails are as long as the stairways. Check any carpeting to make sure that it is firmly attached to the stairs. Fix any carpet that is loose or worn. Avoid having throw rugs at the top or bottom of the stairs. If you do have throw rugs, attach them to the floor with carpet tape. Make sure that you have a light switch at the top of the stairs and the bottom of the stairs. If you do not have them, ask someone to add them for you. What else can I do to help prevent falls? Wear shoes that: Do not have high heels. Have rubber bottoms. Are comfortable and fit you well. Are closed at the toe. Do not wear sandals. If you use a  stepladder: Make sure that it is fully opened. Do not climb a closed stepladder. Make sure that both sides of the stepladder are locked into place. Ask someone to hold it for you, if possible. Clearly Norma and make sure that you can see: Any grab bars or handrails. First and last steps. Where the edge of each step is. Use tools that help you move around (mobility aids) if they are needed. These include: Canes. Walkers. Scooters. Crutches. Turn on the lights when you go into a dark area. Replace any light bulbs as soon as they burn out. Set up your furniture so you have a clear path. Avoid moving your furniture around. If any of your floors are uneven, fix them. If there are any pets around you, be aware of where they are. Review your medicines with your doctor. Some medicines can make you feel dizzy. This can increase your chance of falling. Ask your doctor what other things that you can do to help prevent falls. This information is not intended to replace advice given to you by your health care provider. Make sure you discuss any questions you have with your health care provider. Document Released: 05/07/2009 Document Revised: 12/17/2015 Document Reviewed: 08/15/2014 Elsevier Interactive Patient Education  2017 [employer].

## 2022-11-16 NOTE — Progress Notes (Signed)
I connected with  Mardene Speak on 11/16/22 by a audio enabled telemedicine application and verified that I am speaking with the correct person using two identifiers.  Patient Location: Home  Provider Location: Office/Clinic  I discussed the limitations of evaluation and management by telemedicine. The patient expressed understanding and agreed to proceed.  Subjective:   Erryn Dickison is a 72 y.o. female who presents for Medicare Annual (Subsequent) preventive examination.  Review of Systems     Cardiac Risk Factors include: advanced age (>37men, >20 women);dyslipidemia;hypertension     Objective:    There were no vitals filed for this visit. There is no height or weight on file to calculate BMI.     11/16/2022    9:02 AM 11/08/2021   10:57 AM 11/04/2020   12:56 PM 12/02/2015    8:26 AM  Advanced Directives  Does Patient Have a Medical Advance Directive? No Yes Yes No  Type of Special educational needs teacher of [place];Living will Healthcare Power of [place];Living will   Copy of Healthcare Power of Attorney in Chart?  No - copy requested No - copy requested   Would patient like information on creating a medical advance directive? No - Patient declined       Current Medications (verified) Outpatient Encounter Medications as of 11/16/2022  Medication Sig   Ascorbic Acid (VITAMIN C) 1000 MG tablet Take 1,000 mg by mouth daily.   aspirin 325 MG tablet Take 1 tablet by mouth daily.   calcium carbonate (OSCAL) 1500 (600 Ca) MG TABS tablet Take by mouth 2 (two) times daily with a meal.   ELDERBERRY PO Take by mouth in the morning and at bedtime.   hydrochlorothiazide (MICROZIDE) 12.5 MG capsule TAKE 1 CAPSULE EVERY DAY   MULTIPLE VITAMIN PO Take by mouth.   nebivolol (BYSTOLIC) 5 MG tablet TAKE 1 TABLET EVERY DAY   Omega-3 Fatty Acids (FISH OIL BURP-LESS) 1000 MG CAPS Take 1 capsule by mouth daily.   Omeprazole-Sodium Bicarbonate (ZEGERID OTC) 20-1100 MG CAPS  capsule Take 1 capsule by mouth daily.   pravastatin (PRAVACHOL) 20 MG tablet TAKE 1 TABLET (20 MG TOTAL) BY MOUTH DAILY.   valsartan (DIOVAN) 320 MG tablet TAKE 1 TABLET EVERY DAY   No facility-administered encounter medications on file as of 11/16/2022.    Allergies (verified) Diclofenac, Ace inhibitors, Atorvastatin, and Olmesartan   History: Past Medical History:  Diagnosis Date   GERD (gastroesophageal reflux disease)    Hypertension    Past Surgical History:  Procedure Laterality Date   BILATERAL TOTAL MASTECTOMY WITH AXILLARY LYMPH NODE DISSECTION  05/2014   DCIS right breast ER/PR-   CAROTID ENDARTERECTOMY  2009   COLONOSCOPY  2008   Family History  Problem Relation Age of Onset   Hypertension Mother    Hypertension Father    Social History   Socioeconomic History   Marital status: Single    Spouse name: Not on file   Number of children: Not on file   Years of education: Not on file   Highest education level: Not on file  Occupational History   Not on file  Tobacco Use   Smoking status: Never   Smokeless tobacco: Never  Vaping Use   Vaping Use: Never used  Substance and Sexual Activity   Alcohol use: Yes    Alcohol/week: 2.0 standard drinks of alcohol    Types: 2 Standard drinks or equivalent per week   Drug use: Never   Sexual activity: Not on  file  Other Topics Concern   Not on file  Social History Narrative   Pt lives alone   Social Determinants of Health   Financial Resource Strain: Low Risk  (11/16/2022)   Overall Financial Resource Strain (CARDIA)    Difficulty of Paying Living Expenses: Not hard at all  Food Insecurity: No Food Insecurity (11/16/2022)   Hunger Vital Sign    Worried About Running Out of Food in the Last Year: Never true    Ran Out of Food in the Last Year: Never true  Transportation Needs: No Transportation Needs (11/16/2022)   PRAPARE - Administrator, Civil Service (Medical): No    Lack of Transportation  (Non-Medical): No  Physical Activity: Sufficiently Active (11/16/2022)   Exercise Vital Sign    Days of Exercise per Week: 6 days    Minutes of Exercise per Session: 60 min  Stress: No Stress Concern Present (11/16/2022)   [employer] of Occupational Health - Occupational Stress Questionnaire    Feeling of Stress : Not at all  Social Connections: Moderately Integrated (11/16/2022)   Social Connection and Isolation Panel [NHANES]    Frequency of Communication with Friends and Family: More than three times a week    Frequency of Social Gatherings with Friends and Family: More than three times a week    Attends Religious Services: More than 4 times per year    Active Member of [employer] or Organizations: Yes    Attends Engineer, structural: More than 4 times per year    Marital Status: Never married    Tobacco Counseling Counseling given: Not Answered   Clinical Intake:  Pre-visit preparation completed: Yes  Pain : No/denies pain     Nutritional Risks: None Diabetes: No  How often do you need to have someone help you when you read instructions, pamphlets, or other written materials from your doctor or pharmacy?: 1 - Never  Diabetic?no  Interpreter Needed?: No  Information entered by :: Kennedy Bucker, LPN   Activities of Daily Living    11/16/2022    9:03 AM  In your present state of health, do you have any difficulty performing the following activities:  Hearing? 0  Vision? 0  Difficulty concentrating or making decisions? 0  Walking or climbing stairs? 0  Dressing or bathing? 0  Doing errands, shopping? 0  Preparing Food and eating ? N  Using the Toilet? N  In the past six months, have you accidently leaked urine? N  Do you have problems with loss of bowel control? N  Managing your Medications? N  Managing your Finances? N  Housekeeping or managing your Housekeeping? N    Patient Care Team: Reubin Milan, MD as PCP - General (Internal  Medicine) Hanley Seamen, DO (General Surgery) Verlin Dike (Gynecology) Dewaine Conger, MD as Referring Physician (Gastroenterology)  Indicate any recent Medical Services you may have received from other than [facility] providers in the past year (date may be approximate).     Assessment:   This is a routine wellness examination for Hamda.  Hearing/Vision screen Hearing Screening - Comments:: No aids Vision Screening - Comments:: No glasses- Dr.Bryan   Dietary issues and exercise activities discussed: Current Exercise Habits: Home exercise routine, Type of exercise: walking, Time (Minutes): 60, Frequency (Times/Week): 6, Weekly Exercise (Minutes/Week): 360, Intensity: Mild   Goals Addressed             This Visit's Progress    DIET -  EAT MORE FRUITS AND VEGETABLES         Depression Screen    11/16/2022    9:01 AM 02/09/2022    9:56 AM 11/08/2021   10:56 AM 08/02/2021    8:10 AM 01/29/2021    8:36 AM 11/04/2020   12:54 PM 07/06/2020   10:54 AM  PHQ 2/9 Scores  PHQ - 2 Score 0 0 0 0 0 0 0  PHQ- 9 Score 0 0  0 0  0    Fall Risk    11/16/2022    9:02 AM 02/09/2022    9:57 AM 11/08/2021   10:57 AM 08/02/2021    8:10 AM 01/29/2021    8:36 AM  Fall Risk   Falls in the past year? 0 0 0 0 0  Number falls in past yr: 0 0 0 0 0  Injury with Fall? 0 0 0 0 0  Risk for fall due to : No Fall Risks No Fall Risks No Fall Risks No Fall Risks   Follow up Falls prevention discussed;Falls evaluation completed Falls evaluation completed Falls prevention discussed Falls evaluation completed Falls evaluation completed    FALL RISK PREVENTION PERTAINING TO THE HOME:  Any stairs in or around the home? Yes  If so, are there any without handrails? No  Home free of loose throw rugs in walkways, pet beds, electrical cords, etc? Yes  Adequate lighting in your home to reduce risk of falls? Yes   ASSISTIVE DEVICES UTILIZED TO PREVENT FALLS:  Life alert? No  Use of a cane, walker or w/c? No   Grab bars in the bathroom? No  Shower chair or bench in shower? No  Elevated toilet seat or a handicapped toilet? Yes    Cognitive Function:        11/16/2022    9:06 AM  6CIT Screen  What Year? 0 points  What month? 0 points  What time? 0 points  Count back from 20 0 points  Months in reverse 0 points  Repeat phrase 0 points  Total Score 0 points    Immunizations Immunization History  Administered Date(s) Administered   Fluad Quad(high Dose 65+) 04/22/2019, 05/15/2020, 05/26/2022   Influenza,inj,Quad PF,6+ Mos 06/02/2017, 05/26/2018   Influenza-Unspecified 05/17/2021   Moderna SARS-COV2 Booster Vaccination 12/04/2020, 07/23/2021   Moderna Sars-Covid-2 Vaccination 08/20/2019, 09/17/2019, 06/12/2020   Pneumococcal Conjugate-13 08/14/2018   Pneumococcal Polysaccharide-23 11/25/2019   Tdap 11/01/2012   Zoster Recombinat (Shingrix) 07/08/2017, 11/06/2017    TDAP status: Due, Education has been provided regarding the importance of this vaccine. Advised may receive this vaccine at local pharmacy or Health Dept. Aware to provide a copy of the vaccination record if obtained from local pharmacy or Health Dept. Verbalized acceptance and understanding.  Flu Vaccine status: Up to date  Pneumococcal vaccine status: Up to date  Covid-19 vaccine status: Completed vaccines  Qualifies for Shingles Vaccine? Yes   Zostavax completed No   Shingrix Completed?: Yes  Screening Tests Health Maintenance  Topic Date Due   COVID-19 Vaccine (6 - 2023-24 season) 03/25/2022   DTaP/Tdap/Td (2 - Td or Tdap) 11/02/2022   INFLUENZA VACCINE  02/23/2023   Medicare Annual Wellness (AWV)  11/16/2023   COLONOSCOPY (Pts 45-37yrs Insurance coverage will need to be confirmed)  03/20/2028   Pneumonia Vaccine 13+ Years old  Completed   DEXA SCAN  Completed   Zoster Vaccines- Shingrix  Completed   Hepatitis C Screening  Addressed   HPV VACCINES  Aged Out  Health Maintenance  Health Maintenance  Due  Topic Date Due   COVID-19 Vaccine (6 - 2023-24 season) 03/25/2022   DTaP/Tdap/Td (2 - Td or Tdap) 11/02/2022    Colorectal cancer screening: Type of screening: Colonoscopy. Completed 03/20/18. Repeat every 10 years  Mammogram status: No longer required due to double mastectomy.  Bone Density status: Completed 02/24/20. Results reflect: Bone density results: OSTEOPENIA. Repeat every 5 years.  Lung Cancer Screening: (Low Dose CT Chest recommended if Age 76-80 years, 30 pack-year currently smoking OR have quit w/in 15years.) does not qualify.    Additional Screening:  Hepatitis C Screening: does qualify; Completed 12/02/15  Vision Screening: Recommended annual ophthalmology exams for early detection of glaucoma and other disorders of the eye. Is the patient up to date with their annual eye exam?  Yes  Who is the provider or what is the name of the office in which the patient attends annual eye exams? Dr.Bryan If pt is not established with a provider, would they like to be referred to a provider to establish care? No .   Dental Screening: Recommended annual dental exams for proper oral hygiene  Community Resource Referral / Chronic Care Management: CRR required this visit?  No   CCM required this visit?  No      Plan:     I have personally reviewed and noted the following in the patient's chart:   Medical and social history Use of alcohol, tobacco or illicit drugs  Current medications and supplements including opioid prescriptions. Patient is not currently taking opioid prescriptions. Functional ability and status Nutritional status Physical activity Advanced directives List of other physicians Hospitalizations, surgeries, and ER visits in previous 12 months Vitals Screenings to include cognitive, depression, and falls Referrals and appointments  In addition, I have reviewed and discussed with patient certain preventive protocols, quality metrics, and best practice  recommendations. A written personalized care plan for preventive services as well as general preventive health recommendations were provided to patient.     Hal Hope, LPN   1/61/0960   Nurse Notes: none

## 2022-11-17 ENCOUNTER — Encounter: Payer: Self-pay | Admitting: Internal Medicine

## 2022-11-17 ENCOUNTER — Ambulatory Visit: Payer: Medicare PPO | Admitting: Internal Medicine

## 2022-11-17 VITALS — BP 122/62 | HR 65 | Ht 66.0 in | Wt 135.0 lb

## 2022-11-17 DIAGNOSIS — K52831 Collagenous colitis: Secondary | ICD-10-CM | POA: Diagnosis not present

## 2022-11-17 MED ORDER — BUDESONIDE 3 MG PO CPEP: 3.0000 mg | ORAL_CAPSULE | Freq: Three times a day (TID) | ORAL | 1 refills | Status: DC | PRN

## 2022-11-17 NOTE — Assessment & Plan Note (Signed)
GI MD no longer in practice Symptoms are stable and intermittent; usually no longer than 3 days Requesting refill on budesonide - will attempt PA but if unsuccessful, may need GI referral.

## 2022-11-17 NOTE — Progress Notes (Signed)
Date:  11/17/2022   Name:  Sharon Ayers   DOB:  [birth year]   MRN:  [id number]   Chief Complaint: Abdominal Pain (Patient was diagnosed in 2019 with Collagenous Colitis. Was prescribed budesonide . She takes this only as needed and would like to discuss refilling this.)  Diarrhea  This is a recurrent problem. The current episode started more than 1 year ago. The problem has been waxing and waning (has episodes of diarrhea triggered by certain foods/spices/oils). The stool consistency is described as Watery. The patient states that diarrhea does not awaken her from sleep. Associated symptoms include abdominal pain. Pertinent negatives include no bloating, chills, fever, headaches, myalgias or weight loss. Treatments tried: treated with Budesonide for collagenous colitis.    Lab Results  Component Value Date   NA 139 02/09/2022   K 4.3 02/09/2022   CO2 22 02/09/2022   GLUCOSE 94 02/09/2022   BUN 15 02/09/2022   CREATININE 0.81 02/09/2022   CALCIUM 10.0 02/09/2022   EGFR 79 02/09/2022   GFRNONAA 85 01/24/2020   Lab Results  Component Value Date   CHOL 260 (H) 02/09/2022   HDL 130 02/09/2022   LDLCALC 124 (H) 02/09/2022   TRIG 39 02/09/2022   CHOLHDL 2.0 02/09/2022   Lab Results  Component Value Date   TSH 0.874 02/09/2022   No results found for: "HGBA1C" Lab Results  Component Value Date   WBC 8.4 02/09/2022   HGB 13.9 02/09/2022   HCT 40.7 02/09/2022   MCV 94 02/09/2022   PLT 238 02/09/2022   Lab Results  Component Value Date   ALT 21 02/09/2022   AST 24 02/09/2022   ALKPHOS 82 02/09/2022   BILITOT 0.4 02/09/2022   Lab Results  Component Value Date   VD25OH 69.3 02/09/2022     Review of Systems  Constitutional:  Negative for chills, fatigue, fever and weight loss.  HENT:  Negative for trouble swallowing.   Respiratory:  Negative for chest tightness and shortness of breath.   Cardiovascular:  Negative for chest pain.  Gastrointestinal:  Positive  for abdominal pain and diarrhea. Negative for bloating.  Musculoskeletal:  Negative for myalgias.  Neurological:  Negative for dizziness, light-headedness and headaches.  Psychiatric/Behavioral:  Negative for dysphoric mood and sleep disturbance. The patient is not nervous/anxious.     Patient Active Problem List   Diagnosis Date Noted   Collagenous colitis 07/25/2017   Dyslipidemia 05/27/2015   Essential (primary) hypertension 05/27/2015   Generalized OA 05/27/2015   Esophagitis, reflux 05/27/2015   OP (osteoporosis) 05/27/2015   History of right breast cancer 03/12/2014   Abnormal Pap smear of cervix 03/26/2013   Carotid artery narrowing 11/27/2012    Allergies  Allergen Reactions   Diclofenac Diarrhea and Nausea And Vomiting   Ace Inhibitors Cough and Other (See Comments)    Other reaction(s): Cough   Atorvastatin Other (See Comments)    Severe back pain Severe back pain   Olmesartan Diarrhea    Past Surgical History:  Procedure Laterality Date   BILATERAL TOTAL MASTECTOMY WITH AXILLARY LYMPH NODE DISSECTION  05/2014   DCIS right breast ER/PR-   CAROTID ENDARTERECTOMY  2009   COLONOSCOPY  2008    Social History   Tobacco Use   Smoking status: Never   Smokeless tobacco: Never  Vaping Use   Vaping Use: Never used  Substance Use Topics   Alcohol use: Yes    Alcohol/week: 2.0 standard drinks of alcohol    Types:  2 Standard drinks or equivalent per week   Drug use: Never     Medication list has been reviewed and updated.  Current Meds  Medication Sig   Ascorbic Acid (VITAMIN C) 1000 MG tablet Take 1,000 mg by mouth daily.   aspirin 325 MG tablet Take 1 tablet by mouth daily.   calcium carbonate (OSCAL) 1500 (600 Ca) MG TABS tablet Take by mouth 2 (two) times daily with a meal.   ELDERBERRY PO Take by mouth in the morning and at bedtime.   hydrochlorothiazide (MICROZIDE) 12.5 MG capsule TAKE 1 CAPSULE EVERY DAY   MULTIPLE VITAMIN PO Take by mouth.    nebivolol (BYSTOLIC) 5 MG tablet TAKE 1 TABLET EVERY DAY   Omega-3 Fatty Acids (FISH OIL BURP-LESS) 1000 MG CAPS Take 1 capsule by mouth daily.   Omeprazole-Sodium Bicarbonate (ZEGERID OTC) 20-1100 MG CAPS capsule Take 1 capsule by mouth daily.   pravastatin (PRAVACHOL) 20 MG tablet TAKE 1 TABLET (20 MG TOTAL) BY MOUTH DAILY.   valsartan (DIOVAN) 320 MG tablet TAKE 1 TABLET EVERY DAY   [DISCONTINUED] budesonide (ENTOCORT EC) 3 MG 24 hr capsule Take 3 mg by mouth 3 (three) times daily as needed (Collagenous Colitis).       11/17/2022   11:21 AM 02/09/2022    9:57 AM 08/02/2021    8:10 AM 01/29/2021    8:36 AM  GAD 7 : Generalized Anxiety Score  Nervous, Anxious, on Edge 0 0 0 0  Control/stop worrying 0 0 0 0  Worry too much - different things 0 0 0 0  Trouble relaxing 0 0 0 0  Restless 0 0 0 0  Easily annoyed or irritable 0 0 0 0  Afraid - awful might happen 0 0 0 0  Total GAD 7 Score 0 0 0 0  Anxiety Difficulty Not difficult at all Not difficult at all  Not difficult at all       11/17/2022   11:21 AM 11/16/2022    9:01 AM 02/09/2022    9:56 AM  Depression screen PHQ 2/9  Decreased Interest 0 0 0  Down, Depressed, Hopeless 0 0 0  PHQ - 2 Score 0 0 0  Altered sleeping 0 0 0  Tired, decreased energy 0 0 0  Change in appetite 0 0 0  Feeling bad or failure about yourself  0 0 0  Trouble concentrating 0 0 0  Moving slowly or fidgety/restless 0 0 0  Suicidal thoughts 0 0 0  PHQ-9 Score 0 0 0  Difficult doing work/chores Not difficult at all Not difficult at all Not difficult at all    BP Readings from Last 3 Encounters:  11/17/22 122/62  09/12/22 136/72  02/09/22 (!) 142/80    Physical Exam Vitals and nursing note reviewed.  Constitutional:      General: She is not in acute distress.    Appearance: She is well-developed.  HENT:     Head: Normocephalic and atraumatic.  Cardiovascular:     Rate and Rhythm: Normal rate and regular rhythm.  Pulmonary:     Effort: Pulmonary  effort is normal. No respiratory distress.     Breath sounds: Normal breath sounds.  Abdominal:     General: Abdomen is flat. Bowel sounds are normal.     Palpations: Abdomen is soft.     Tenderness: There is no abdominal tenderness.  Skin:    General: Skin is warm and dry.     Findings: No rash.  Neurological:  Mental Status: She is alert and oriented to person, place, and time.  Psychiatric:        Mood and Affect: Mood normal.        Behavior: Behavior normal.     Wt Readings from Last 3 Encounters:  11/17/22 135 lb (61.2 kg)  11/16/22 137 lb (62.1 kg)  09/12/22 137 lb (62.1 kg)    BP 122/62   Pulse 65   Ht  (1.676 m)   Wt 135 lb (61.2 kg)   SpO2 97%   BMI 21.79 kg/m   Assessment and Plan:  Problem List Items Addressed This Visit       Digestive   Collagenous colitis - Primary    GI MD no longer in practice Symptoms are stable and intermittent; usually no longer than 3 days Requesting refill on budesonide - will attempt PA but if unsuccessful, may need GI referral.      Relevant Medications   budesonide (ENTOCORT EC) 3 MG 24 hr capsule    No follow-ups on file.   Partially dictated using Dragon software, any errors are not intentional.  Reubin Milan, MD [facility] [place], [place]

## 2022-12-12 ENCOUNTER — Other Ambulatory Visit: Payer: Self-pay | Admitting: Internal Medicine

## 2022-12-12 DIAGNOSIS — E785 Hyperlipidemia, unspecified: Secondary | ICD-10-CM

## 2022-12-13 NOTE — Telephone Encounter (Signed)
Requested Prescriptions  Pending Prescriptions Disp Refills   pravastatin (PRAVACHOL) 20 MG tablet [Pharmacy Med Name: PRAVASTATIN SODIUM 20 MG Tablet] 90 tablet 0    Sig: TAKE 1 TABLET (20 MG TOTAL) BY MOUTH DAILY.     Cardiovascular:  Antilipid - Statins Failed - 12/12/2022  2:53 PM      Failed - Lipid Panel in normal range within the last 12 months    Cholesterol, Total  Date Value Ref Range Status  02/09/2022 260 (H) 100 - 199 mg/dL Final   LDL Chol Calc (NIH)  Date Value Ref Range Status  02/09/2022 124 (H) 0 - 99 mg/dL Final   HDL  Date Value Ref Range Status  02/09/2022 130 >39 mg/dL Final   Triglycerides  Date Value Ref Range Status  02/09/2022 39 0 - 149 mg/dL Final         Passed - Patient is not pregnant      Passed - Valid encounter within last 12 months    Recent Outpatient Visits           3 weeks ago Collagenous colitis   [place] [facility] at [facility], Nyoka Cowden, MD   3 months ago Essential (primary) hypertension   [place] [facility] at [facility] [place], Nyoka Cowden, MD   10 months ago Annual physical exam   [facility] at [facility], Nyoka Cowden, MD   1 year ago Essential (primary) hypertension   [place] [facility] at [facility], Nyoka Cowden, MD   1 year ago Annual physical exam   [facility] at [facility], Nyoka Cowden, MD       Future Appointments             In 2 months Judithann Graves, Nyoka Cowden, MD [facility] at [facility], [facility]

## 2023-02-14 ENCOUNTER — Ambulatory Visit: Payer: Medicare PPO | Admitting: Internal Medicine

## 2023-02-14 ENCOUNTER — Encounter: Payer: Self-pay | Admitting: Internal Medicine

## 2023-02-14 VITALS — BP 120/68 | HR 60 | Ht 66.0 in | Wt 131.8 lb

## 2023-02-14 DIAGNOSIS — I10 Essential (primary) hypertension: Secondary | ICD-10-CM

## 2023-02-14 DIAGNOSIS — Z23 Encounter for immunization: Secondary | ICD-10-CM

## 2023-02-14 DIAGNOSIS — Z0000 Encounter for general adult medical examination without abnormal findings: Secondary | ICD-10-CM

## 2023-02-14 DIAGNOSIS — K52831 Collagenous colitis: Secondary | ICD-10-CM | POA: Diagnosis not present

## 2023-02-14 DIAGNOSIS — K2100 Gastro-esophageal reflux disease with esophagitis, without bleeding: Secondary | ICD-10-CM

## 2023-02-14 DIAGNOSIS — E785 Hyperlipidemia, unspecified: Secondary | ICD-10-CM | POA: Diagnosis not present

## 2023-02-14 LAB — POCT URINALYSIS DIPSTICK
Bilirubin, UA: NEGATIVE
Blood, UA: NEGATIVE
Glucose, UA: NEGATIVE
Ketones, UA: NEGATIVE
Leukocytes, UA: NEGATIVE
Nitrite, UA: NEGATIVE
Protein, UA: NEGATIVE
Spec Grav, UA: 1.01 (ref 1.010–1.025)
Urobilinogen, UA: 0.2 E.U./dL
pH, UA: 6.5 (ref 5.0–8.0)

## 2023-02-14 MED ORDER — PRAVASTATIN SODIUM 20 MG PO TABS
20.0000 mg | ORAL_TABLET | Freq: Every day | ORAL | 3 refills | Status: DC
Start: 2023-02-14 — End: 2024-01-16

## 2023-02-14 NOTE — Progress Notes (Signed)
Date:  02/14/2023   Name:  Sharon Ayers   DOB:  [birth year]   MRN:  [id number]   Chief Complaint: Annual Exam Sharon Ayers is a 72 y.o. female who presents today for her Complete Annual Exam. She feels well. She reports exercising. She reports she is sleeping well. Breast complaints - none.  Mammogram: d/c'd DEXA: 02/2020 osteopenia Colonoscopy: 02/2018 repeat 10 yrs (collagenous colitis)  Health Maintenance Due  Topic Date Due   COVID-19 Vaccine (6 - 2023-24 season) 03/25/2022   DTaP/Tdap/Td (2 - Td or Tdap) 11/02/2022    Immunization History  Administered Date(s) Administered   Fluad Quad(high Dose 65+) 04/22/2019, 05/15/2020, 05/26/2022   Influenza,inj,Quad PF,6+ Mos 06/02/2017, 05/26/2018   Influenza-Unspecified 05/17/2021   Moderna SARS-COV2 Booster Vaccination 12/04/2020, 07/23/2021   Moderna Sars-Covid-2 Vaccination 08/20/2019, 09/17/2019, 06/12/2020   PNEUMOCOCCAL CONJUGATE-20 02/14/2023   Pneumococcal Conjugate-13 08/14/2018   Pneumococcal Polysaccharide-23 11/25/2019   Tdap 11/01/2012   Zoster Recombinant(Shingrix) 07/08/2017, 11/06/2017    Hypertension This is a chronic problem. The problem is controlled. Pertinent negatives include no chest pain, headaches, palpitations or shortness of breath. Past treatments include angiotensin blockers, beta blockers and diuretics. The current treatment provides significant improvement. There is no history of kidney disease, CAD/MI or CVA.  Hyperlipidemia This is a chronic problem. The problem is controlled. Pertinent negatives include no chest pain or shortness of breath. Current antihyperlipidemic treatment includes statins. The current treatment provides moderate improvement of lipids.  Gastroesophageal Reflux She complains of heartburn. She reports no abdominal pain, no chest pain, no coughing or no wheezing. This is a recurrent problem. The problem occurs rarely. Pertinent negatives include no fatigue. She has  tried a PPI and an antacid for the symptoms.    Lab Results  Component Value Date   NA 139 02/09/2022   K 4.3 02/09/2022   CO2 22 02/09/2022   GLUCOSE 94 02/09/2022   BUN 15 02/09/2022   CREATININE 0.81 02/09/2022   CALCIUM 10.0 02/09/2022   EGFR 79 02/09/2022   GFRNONAA 85 01/24/2020   Lab Results  Component Value Date   CHOL 260 (H) 02/09/2022   HDL 130 02/09/2022   LDLCALC 124 (H) 02/09/2022   TRIG 39 02/09/2022   CHOLHDL 2.0 02/09/2022   Lab Results  Component Value Date   TSH 0.874 02/09/2022   No results found for: "HGBA1C" Lab Results  Component Value Date   WBC 8.4 02/09/2022   HGB 13.9 02/09/2022   HCT 40.7 02/09/2022   MCV 94 02/09/2022   PLT 238 02/09/2022   Lab Results  Component Value Date   ALT 21 02/09/2022   AST 24 02/09/2022   ALKPHOS 82 02/09/2022   BILITOT 0.4 02/09/2022   Lab Results  Component Value Date   VD25OH 69.3 02/09/2022     Review of Systems  Constitutional:  Negative for chills, fatigue and fever.  HENT:  Negative for congestion, hearing loss, tinnitus, trouble swallowing and voice change.   Eyes:  Negative for visual disturbance.  Respiratory:  Negative for cough, chest tightness, shortness of breath and wheezing.   Cardiovascular:  Negative for chest pain, palpitations and leg swelling.  Gastrointestinal:  Positive for heartburn. Negative for abdominal pain, constipation, diarrhea and vomiting.  Endocrine: Negative for polydipsia and polyuria.  Genitourinary:  Negative for dysuria, frequency, genital sores, vaginal bleeding and vaginal discharge.  Musculoskeletal:  Negative for arthralgias, gait problem and joint swelling.  Skin:  Negative for color change and rash.  Neurological:  Negative for dizziness, tremors, light-headedness and headaches.  Hematological:  Negative for adenopathy. Does not bruise/bleed easily.  Psychiatric/Behavioral:  Negative for dysphoric mood and sleep disturbance. The patient is not  nervous/anxious.     Patient Active Problem List   Diagnosis Date Noted   Collagenous colitis 07/25/2017   Dyslipidemia 05/27/2015   Essential (primary) hypertension 05/27/2015   Generalized OA 05/27/2015   Esophagitis, reflux 05/27/2015   OP (osteoporosis) 05/27/2015   History of right breast cancer 03/12/2014   Abnormal Pap smear of cervix 03/26/2013   Carotid artery narrowing 11/27/2012    Allergies  Allergen Reactions   Diclofenac Diarrhea and Nausea And Vomiting   Ace Inhibitors Cough and Other (See Comments)    Other reaction(s): Cough   Atorvastatin Other (See Comments)    Severe back pain Severe back pain   Olmesartan Diarrhea    Past Surgical History:  Procedure Laterality Date   BILATERAL TOTAL MASTECTOMY WITH AXILLARY LYMPH NODE DISSECTION  05/2014   DCIS right breast ER/PR-   CAROTID ENDARTERECTOMY  2009   COLONOSCOPY  2008    Social History   Tobacco Use   Smoking status: Never   Smokeless tobacco: Never  Vaping Use   Vaping status: Never Used  Substance Use Topics   Alcohol use: Yes    Alcohol/week: 2.0 standard drinks of alcohol    Types: 2 Standard drinks or equivalent per week   Drug use: Never     Medication list has been reviewed and updated.  Current Meds  Medication Sig   Ascorbic Acid (VITAMIN C) 1000 MG tablet Take 1,000 mg by mouth daily.   aspirin 325 MG tablet Take 1 tablet by mouth daily.   budesonide (ENTOCORT EC) 3 MG 24 hr capsule Take 1 capsule (3 mg total) by mouth 3 (three) times daily as needed (Collagenous Colitis).   calcium carbonate (OSCAL) 1500 (600 Ca) MG TABS tablet Take by mouth 2 (two) times daily with a meal.   ELDERBERRY PO Take by mouth in the morning and at bedtime.   hydrochlorothiazide (MICROZIDE) 12.5 MG capsule TAKE 1 CAPSULE EVERY DAY   MULTIPLE VITAMIN PO Take by mouth.   nebivolol (BYSTOLIC) 5 MG tablet TAKE 1 TABLET EVERY DAY   Omega-3 Fatty Acids (FISH OIL BURP-LESS) 1000 MG CAPS Take 1 capsule by  mouth daily.   Omeprazole-Sodium Bicarbonate (ZEGERID OTC) 20-1100 MG CAPS capsule Take 1 capsule by mouth daily.   valsartan (DIOVAN) 320 MG tablet TAKE 1 TABLET EVERY DAY   [DISCONTINUED] pravastatin (PRAVACHOL) 20 MG tablet TAKE 1 TABLET (20 MG TOTAL) BY MOUTH DAILY.       02/14/2023    7:56 AM 11/17/2022   11:21 AM 02/09/2022    9:57 AM 08/02/2021    8:10 AM  GAD 7 : Generalized Anxiety Score  Nervous, Anxious, on Edge 0 0 0 0  Control/stop worrying 0 0 0 0  Worry too much - different things 0 0 0 0  Trouble relaxing 0 0 0 0  Restless 0 0 0 0  Easily annoyed or irritable 0 0 0 0  Afraid - awful might happen 0 0 0 0  Total GAD 7 Score 0 0 0 0  Anxiety Difficulty Not difficult at all Not difficult at all Not difficult at all        02/14/2023    7:55 AM 11/17/2022   11:21 AM 11/16/2022    9:01 AM  Depression screen PHQ 2/9  Decreased Interest 0 0  0  Down, Depressed, Hopeless 0 0 0  PHQ - 2 Score 0 0 0  Altered sleeping 0 0 0  Tired, decreased energy 0 0 0  Change in appetite 0 0 0  Feeling bad or failure about yourself  0 0 0  Trouble concentrating 0 0 0  Moving slowly or fidgety/restless 0 0 0  Suicidal thoughts 0 0 0  PHQ-9 Score 0 0 0  Difficult doing work/chores Not difficult at all Not difficult at all Not difficult at all    BP Readings from Last 3 Encounters:  02/14/23 120/68  11/17/22 122/62  09/12/22 136/72    Physical Exam Vitals and nursing note reviewed.  Constitutional:      General: She is not in acute distress.    Appearance: She is well-developed.  HENT:     Head: Normocephalic and atraumatic.     Right Ear: Tympanic membrane and ear canal normal.     Left Ear: Tympanic membrane and ear canal normal.     Nose:     Right Sinus: No maxillary sinus tenderness.     Left Sinus: No maxillary sinus tenderness.  Eyes:     General: No scleral icterus.       Right eye: No discharge.        Left eye: No discharge.     Conjunctiva/sclera: Conjunctivae  normal.  Neck:     Thyroid: No thyromegaly.     Vascular: No carotid bruit.  Cardiovascular:     Rate and Rhythm: Normal rate and regular rhythm.     Pulses: Normal pulses.     Heart sounds: Normal heart sounds.  Pulmonary:     Effort: Pulmonary effort is normal. No respiratory distress.     Breath sounds: No wheezing.  Chest:  Breasts:    Right: Absent.     Left: Absent.  Abdominal:     General: Bowel sounds are normal.     Palpations: Abdomen is soft.     Tenderness: There is no abdominal tenderness.  Musculoskeletal:     Cervical back: Normal range of motion. No erythema.     Right lower leg: No edema.     Left lower leg: No edema.  Lymphadenopathy:     Cervical: No cervical adenopathy.  Skin:    General: Skin is warm and dry.     Findings: No rash.  Neurological:     Mental Status: She is alert and oriented to person, place, and time.     Cranial Nerves: No cranial nerve deficit.     Sensory: No sensory deficit.     Deep Tendon Reflexes: Reflexes are normal and symmetric.  Psychiatric:        Attention and Perception: Attention normal.        Mood and Affect: Mood normal.     Wt Readings from Last 3 Encounters:  02/14/23 131 lb 12.8 oz (59.8 kg)  11/17/22 135 lb (61.2 kg)  11/16/22 137 lb (62.1 kg)    BP 120/68   Pulse 60   Ht 5\' 6"  (1.676 m)   Wt 131 lb 12.8 oz (59.8 kg)   SpO2 98%   BMI 21.27 kg/m   Assessment and Plan:  Problem List Items Addressed This Visit     Essential (primary) hypertension (Chronic)    Normal exam with stable BP on Valsartan, Bystolic and hctz. No concerns or side effects to current medication. No change in regimen; continue low sodium diet.  Relevant Medications   pravastatin (PRAVACHOL) 20 MG tablet   Other Relevant Orders   Comprehensive metabolic panel   TSH   POCT urinalysis dipstick (Completed)   Esophagitis, reflux (Chronic)    Reflux symptoms are minimal on current therapy - Zegerid. No red flag signs  such as weight loss, n/v, melena       Relevant Orders   CBC with Differential/Platelet   Dyslipidemia (Chronic)    LDL is  Lab Results  Component Value Date   LDLCALC 124 (H) 02/09/2022   Currently being treated with pravachol with good compliance and no concerns.       Relevant Medications   pravastatin (PRAVACHOL) 20 MG tablet   Other Relevant Orders   Lipid panel   Collagenous colitis    Symptoms stable -has not had to use budesonide. Colonoscopy due in 2029      Relevant Orders   CBC with Differential/Platelet   Other Visit Diagnoses     Annual physical exam    -  Primary   Continue regular exercise and healthy diet recent [employer] screening normal.   Need for vaccination for pneumococcus       Relevant Orders   Pneumococcal conjugate vaccine 20-valent (Completed)       Return in about 6 months (around 08/17/2023) for HTN.    Reubin Milan, MD [facility] [place]

## 2023-02-14 NOTE — Assessment & Plan Note (Signed)
Reflux symptoms are minimal on current therapy - Zegerid. No red flag signs such as weight loss, n/v, melena

## 2023-02-14 NOTE — Assessment & Plan Note (Addendum)
Symptoms stable -has not had to use budesonide. Colonoscopy due in 2029

## 2023-02-14 NOTE — Assessment & Plan Note (Signed)
Normal exam with stable BP on Valsartan, Bystolic and hctz. No concerns or side effects to current medication. No change in regimen; continue low sodium diet.

## 2023-02-14 NOTE — Assessment & Plan Note (Signed)
LDL is  Lab Results  Component Value Date   LDLCALC 124 (H) 02/09/2022   Currently being treated with pravachol with good compliance and no concerns.

## 2023-02-15 LAB — LIPID PANEL
Chol/HDL Ratio: 2.1 ratio (ref 0.0–4.4)
Cholesterol, Total: 226 mg/dL — ABNORMAL HIGH (ref 100–199)
HDL: 108 mg/dL (ref >39)
LDL Chol Calc (NIH): 109 mg/dL — ABNORMAL HIGH (ref 0–99)
Triglycerides: 53 mg/dL (ref 0–149)
VLDL Cholesterol Cal: 9 mg/dL (ref 5–40)

## 2023-02-15 LAB — COMPREHENSIVE METABOLIC PANEL
ALT: 16 IU/L (ref 0–32)
AST: 20 IU/L (ref 0–40)
Albumin: 4.6 g/dL (ref 3.9–4.9)
Alkaline Phosphatase: 76 IU/L (ref 44–121)
BUN/Creatinine Ratio: 16 (ref 12–28)
BUN: 12 mg/dL (ref 8–27)
Bilirubin Total: 0.4 mg/dL (ref 0.0–1.2)
CO2: 25 mmol/L (ref 20–29)
Calcium: 9.9 mg/dL (ref 8.7–10.3)
Chloride: 96 mmol/L (ref 96–106)
Creatinine, Ser: 0.75 mg/dL (ref 0.57–1.00)
Globulin, Total: 1.9 g/dL (ref 1.5–4.5)
Glucose: 102 mg/dL — ABNORMAL HIGH (ref 70–99)
Potassium: 5.1 mmol/L (ref 3.5–5.2)
Sodium: 136 mmol/L (ref 134–144)
Total Protein: 6.5 g/dL (ref 6.0–8.5)
eGFR: 86 mL/min/{1.73_m2} (ref >59)

## 2023-02-15 LAB — CBC WITH DIFFERENTIAL/PLATELET
Basophils Absolute: 0 10*3/uL (ref 0.0–0.2)
Basos: 1 %
EOS (ABSOLUTE): 0.1 10*3/uL (ref 0.0–0.4)
Eos: 1 %
Hematocrit: 40.1 % (ref 34.0–46.6)
Hemoglobin: 13.4 g/dL (ref 11.1–15.9)
Immature Grans (Abs): 0 10*3/uL (ref 0.0–0.1)
Immature Granulocytes: 0 %
Lymphocytes Absolute: 2.5 10*3/uL (ref 0.7–3.1)
Lymphs: 33 %
MCH: 31.9 pg (ref 26.6–33.0)
MCHC: 33.4 g/dL (ref 31.5–35.7)
MCV: 96 fL (ref 79–97)
Monocytes Absolute: 0.6 10*3/uL (ref 0.1–0.9)
Monocytes: 8 %
Neutrophils Absolute: 4.5 10*3/uL (ref 1.4–7.0)
Neutrophils: 57 %
Platelets: 284 10*3/uL (ref 150–450)
RBC: 4.2 x10E6/uL (ref 3.77–5.28)
RDW: 12.2 % (ref 11.7–15.4)
WBC: 7.7 10*3/uL (ref 3.4–10.8)

## 2023-02-15 LAB — TSH: TSH: 1.06 u[IU]/mL (ref 0.450–4.500)

## 2023-06-20 ENCOUNTER — Other Ambulatory Visit: Payer: Self-pay | Admitting: Internal Medicine

## 2023-06-20 DIAGNOSIS — I10 Essential (primary) hypertension: Secondary | ICD-10-CM

## 2023-06-21 NOTE — Telephone Encounter (Signed)
Requested Prescriptions  Pending Prescriptions Disp Refills   nebivolol (BYSTOLIC) 5 MG tablet [Pharmacy Med Name: Nebivolol HCl Oral Tablet 5 MG] 90 tablet 1    Sig: TAKE 1 TABLET EVERY DAY     Cardiovascular: Beta Blockers 3 Passed - 06/20/2023 11:51 AM      Passed - Cr in normal range and within 360 days    Creatinine, Ser  Date Value Ref Range Status  02/14/2023 0.75 0.57 - 1.00 mg/dL Final         Passed - AST in normal range and within 360 days    AST  Date Value Ref Range Status  02/14/2023 20 0 - 40 IU/L Final         Passed - ALT in normal range and within 360 days    ALT  Date Value Ref Range Status  02/14/2023 16 0 - 32 IU/L Final         Passed - Last BP in normal range    BP Readings from Last 1 Encounters:  02/14/23 120/68         Passed - Last Heart Rate in normal range    Pulse Readings from Last 1 Encounters:  02/14/23 60         Passed - Valid encounter within last 6 months    Recent Outpatient Visits           4 months ago Annual physical exam   [place] [facility] at [facility], Nyoka Cowden, MD   7 months ago Collagenous colitis   [facility] at [facility], Nyoka Cowden, MD   9 months ago Essential (primary) hypertension   [place] [facility] at [facility], Nyoka Cowden, MD   1 year ago Annual physical exam   [facility] Medicine at [facility], Nyoka Cowden, MD   1 year ago Essential (primary) hypertension   [place] [facility] at [facility], Nyoka Cowden, MD       Future Appointments             In 1 month Judithann Graves, Nyoka Cowden, MD [facility] Primary Care & Sports Medicine at [facility], [facility]   In 8 months Judithann Graves, Nyoka Cowden, MD [facility] Primary Care & Sports Medicine at [facility], [facility]

## 2023-07-08 ENCOUNTER — Other Ambulatory Visit: Payer: Self-pay | Admitting: Internal Medicine

## 2023-07-08 DIAGNOSIS — I10 Essential (primary) hypertension: Secondary | ICD-10-CM

## 2023-07-10 NOTE — Telephone Encounter (Signed)
Rx was sent to [facility] on 09/17/22 #90/3 RF  Requested Prescriptions  Refused Prescriptions Disp Refills   hydrochlorothiazide (MICROZIDE) 12.5 MG capsule [Pharmacy Med Name: hydroCHLOROthiazide Oral Capsule 12.5 MG] 90 capsule 3    Sig: TAKE 1 CAPSULE EVERY DAY     Cardiovascular: Diuretics - Thiazide Passed - 07/10/2023 10:44 AM      Passed - Cr in normal range and within 180 days    Creatinine, Ser  Date Value Ref Range Status  02/14/2023 0.75 0.57 - 1.00 mg/dL Final         Passed - K in normal range and within 180 days    Potassium  Date Value Ref Range Status  02/14/2023 5.1 3.5 - 5.2 mmol/L Final         Passed - Na in normal range and within 180 days    Sodium  Date Value Ref Range Status  02/14/2023 136 134 - 144 mmol/L Final         Passed - Last BP in normal range    BP Readings from Last 1 Encounters:  02/14/23 120/68         Passed - Valid encounter within last 6 months    Recent Outpatient Visits           4 months ago Annual physical exam   [place] [facility] at [facility], Nyoka Cowden, MD   7 months ago Collagenous colitis   [facility] Medicine at [facility], Nyoka Cowden, MD   10 months ago Essential (primary) hypertension   [place] [facility] at [facility], Nyoka Cowden, MD   1 year ago Annual physical exam   [facility] Care & Sports Medicine at [facility], Nyoka Cowden, MD   1 year ago Essential (primary) hypertension   [place] [facility] Medicine at [facility], Nyoka Cowden, MD       Future Appointments             In 2 weeks Judithann Graves, Nyoka Cowden, MD [facility] Primary Care & Sports Medicine at [facility], [facility]   In 7 months Judithann Graves, Nyoka Cowden, MD [facility] Primary Care & Sports Medicine at [facility], [facility]

## 2023-07-17 ENCOUNTER — Other Ambulatory Visit: Payer: Self-pay | Admitting: Internal Medicine

## 2023-07-17 DIAGNOSIS — I10 Essential (primary) hypertension: Secondary | ICD-10-CM

## 2023-07-17 NOTE — Telephone Encounter (Signed)
Medication Refill -  Most Recent Primary Care Visit:  Provider: Reubin Milan  Department: [facility] [place]  Visit Type: PHYSICAL  Date: 02/14/2023  Medication: hydrochlorothiazide (MICROZIDE) 12.5 MG capsule   Has the patient contacted their pharmacy? Yes Pharmacy said need to reach out to provider  Is this the correct pharmacy for this prescription? Yes  This is the patient's preferred pharmacy: [facility] - [place], [place] - [address] [place] [address] Phone: [phone] Fax: [phone]  Has the prescription been filled recently? Yes  Is the patient out of the medication? Yes  Has the patient been seen for an appointment in the last year OR does the patient have an upcoming appointment? Yes  Can we respond through MyChart? No  Agent: Please be advised that Rx refills may take up to 3 business days. We ask that you follow-up with your pharmacy.

## 2023-07-18 MED ORDER — HYDROCHLOROTHIAZIDE 12.5 MG PO CAPS
12.5000 mg | ORAL_CAPSULE | Freq: Every day | ORAL | 0 refills | Status: DC
Start: 2023-07-18 — End: 2023-10-06

## 2023-07-18 NOTE — Telephone Encounter (Signed)
Refilled for 90 days due to being mail order.   Requested Prescriptions  Pending Prescriptions Disp Refills   hydrochlorothiazide (MICROZIDE) 12.5 MG capsule 90 capsule 0    Sig: Take 1 capsule (12.5 mg total) by mouth daily.     Cardiovascular: Diuretics - Thiazide Passed - 07/18/2023  2:18 PM      Passed - Cr in normal range and within 180 days    Creatinine, Ser  Date Value Ref Range Status  02/14/2023 0.75 0.57 - 1.00 mg/dL Final         Passed - K in normal range and within 180 days    Potassium  Date Value Ref Range Status  02/14/2023 5.1 3.5 - 5.2 mmol/L Final         Passed - Na in normal range and within 180 days    Sodium  Date Value Ref Range Status  02/14/2023 136 134 - 144 mmol/L Final         Passed - Last BP in normal range    BP Readings from Last 1 Encounters:  02/14/23 120/68         Passed - Valid encounter within last 6 months    Recent Outpatient Visits           5 months ago Annual physical exam   [place] [facility] at [facility], Nyoka Cowden, MD   8 months ago Collagenous colitis   [place] [facility] Primary Care & Sports Medicine at [facility], Nyoka Cowden, MD   10 months ago Essential (primary) hypertension   [place] [facility] Medicine at [facility], Nyoka Cowden, MD   1 year ago Annual physical exam   [facility] Primary Care & Sports Medicine at [facility], Nyoka Cowden, MD   1 year ago Essential (primary) hypertension   [place] [facility] Sports Medicine at [facility], Nyoka Cowden, MD       Future Appointments             In 1 week Judithann Graves, Nyoka Cowden, MD [facility] Primary Care & Sports Medicine at [facility], [facility]   In 7 months Judithann Graves, Nyoka Cowden, MD [facility] Primary Care & Sports Medicine at [facility], [facility]

## 2023-07-24 ENCOUNTER — Other Ambulatory Visit: Payer: Self-pay | Admitting: Internal Medicine

## 2023-07-24 DIAGNOSIS — I10 Essential (primary) hypertension: Secondary | ICD-10-CM

## 2023-07-27 ENCOUNTER — Ambulatory Visit: Payer: Medicare PPO | Admitting: Internal Medicine

## 2023-07-27 ENCOUNTER — Encounter: Payer: Self-pay | Admitting: Internal Medicine

## 2023-07-27 VITALS — BP 124/72 | HR 71 | Ht 66.0 in | Wt 128.0 lb

## 2023-07-27 DIAGNOSIS — K2100 Gastro-esophageal reflux disease with esophagitis, without bleeding: Secondary | ICD-10-CM

## 2023-07-27 DIAGNOSIS — K52831 Collagenous colitis: Secondary | ICD-10-CM

## 2023-07-27 DIAGNOSIS — I10 Essential (primary) hypertension: Secondary | ICD-10-CM | POA: Diagnosis not present

## 2023-07-27 NOTE — Progress Notes (Signed)
 Date:  07/27/2023   Name:  Sharon Ayers   DOB:  1951/01/15   MRN:  [id number]   Chief Complaint: Hypertension  Hypertension This is a chronic problem. The problem is controlled (120's systolic at home). Pertinent negatives include no chest pain, headaches, palpitations or shortness of breath. Past treatments include diuretics, beta blockers and angiotensin blockers.  Gastroesophageal Reflux She reports no abdominal pain, no chest pain, no coughing, no heartburn, no hoarse voice, no sore throat or no wheezing. The problem occurs rarely. Pertinent negatives include no fatigue. She has tried a PPI for the symptoms. The treatment provided significant relief.    Review of Systems  Constitutional:  Negative for fatigue and unexpected weight change.  HENT:  Negative for hoarse voice, nosebleeds and sore throat.   Eyes:  Negative for visual disturbance.  Respiratory:  Negative for cough, chest tightness, shortness of breath and wheezing.   Cardiovascular:  Negative for chest pain, palpitations and leg swelling.  Gastrointestinal:  Negative for abdominal pain, constipation, diarrhea and heartburn.  Neurological:  Negative for dizziness, weakness, light-headedness and headaches.     Lab Results  Component Value Date   NA 136 02/14/2023   K 5.1 02/14/2023   CO2 25 02/14/2023   GLUCOSE 102 (H) 02/14/2023   BUN 12 02/14/2023   CREATININE 0.75 02/14/2023   CALCIUM 9.9 02/14/2023   EGFR 86 02/14/2023   GFRNONAA 85 01/24/2020   Lab Results  Component Value Date   CHOL 226 (H) 02/14/2023   HDL 108 02/14/2023   LDLCALC 109 (H) 02/14/2023   TRIG 53 02/14/2023   CHOLHDL 2.1 02/14/2023   Lab Results  Component Value Date   TSH 1.060 02/14/2023   No results found for: HGBA1C Lab Results  Component Value Date   WBC 7.7 02/14/2023   HGB 13.4 02/14/2023   HCT 40.1 02/14/2023   MCV 96 02/14/2023   PLT 284 02/14/2023   Lab Results  Component Value Date   ALT 16 02/14/2023    AST 20 02/14/2023   ALKPHOS 76 02/14/2023   BILITOT 0.4 02/14/2023   Lab Results  Component Value Date   VD25OH 69.3 02/09/2022     Patient Active Problem List   Diagnosis Date Noted   Collagenous colitis 07/25/2017   Dyslipidemia 05/27/2015   Essential (primary) hypertension 05/27/2015   Generalized OA 05/27/2015   Esophagitis, reflux 05/27/2015   OP (osteoporosis) 05/27/2015   History of right breast cancer 03/12/2014   Abnormal Pap smear of cervix 03/26/2013   Carotid artery narrowing 11/27/2012    Allergies  Allergen Reactions   Diclofenac Diarrhea and Nausea And Vomiting   Ace Inhibitors Cough and Other (See Comments)    Other reaction(s): Cough   Atorvastatin Other (See Comments)    Severe back pain Severe back pain   Olmesartan  Diarrhea    Past Surgical History:  Procedure Laterality Date   BILATERAL TOTAL MASTECTOMY WITH AXILLARY LYMPH NODE DISSECTION  05/2014   DCIS right breast ER/PR-   CAROTID ENDARTERECTOMY  2009   COLONOSCOPY  2008    Social History   Tobacco Use   Smoking status: Never   Smokeless tobacco: Never  Vaping Use   Vaping status: Never Used  Substance Use Topics   Alcohol use: Yes    Alcohol/week: 2.0 standard drinks of alcohol    Types: 2 Standard drinks or equivalent per week   Drug use: Never     Medication list has been reviewed and updated.  Current Meds  Medication Sig   Ascorbic Acid (VITAMIN C) 1000 MG tablet Take 1,000 mg by mouth daily.   aspirin 325 MG tablet Take 1 tablet by mouth daily.   budesonide  (ENTOCORT EC ) 3 MG 24 hr capsule Take 1 capsule (3 mg total) by mouth 3 (three) times daily as needed (Collagenous Colitis).   calcium carbonate (OSCAL) 1500 (600 Ca) MG TABS tablet Take by mouth 2 (two) times daily with a meal.   ELDERBERRY PO Take by mouth in the morning and at bedtime.   hydrochlorothiazide  (MICROZIDE ) 12.5 MG capsule Take 1 capsule (12.5 mg total) by mouth daily.   MULTIPLE VITAMIN PO Take by  mouth.   nebivolol  (BYSTOLIC ) 5 MG tablet TAKE 1 TABLET EVERY DAY   Omega-3 Fatty Acids (FISH OIL BURP-LESS) 1000 MG CAPS Take 1 capsule by mouth daily.   Omeprazole -Sodium Bicarbonate  (ZEGERID  OTC) 20-1100 MG CAPS capsule Take 1 capsule by mouth daily.   pravastatin  (PRAVACHOL ) 20 MG tablet Take 1 tablet (20 mg total) by mouth daily.   valsartan  (DIOVAN ) 320 MG tablet TAKE 1 TABLET EVERY DAY       07/27/2023    9:36 AM 02/14/2023    7:56 AM 11/17/2022   11:21 AM 02/09/2022    9:57 AM  GAD 7 : Generalized Anxiety Score  Nervous, Anxious, on Edge 0 0 0 0  Control/stop worrying 0 0 0 0  Worry too much - different things 0 0 0 0  Trouble relaxing 0 0 0 0  Restless 0 0 0 0  Easily annoyed or irritable 0 0 0 0  Afraid - awful might happen 0 0 0 0  Total GAD 7 Score 0 0 0 0  Anxiety Difficulty Not difficult at all Not difficult at all Not difficult at all Not difficult at all       07/27/2023    9:36 AM 02/14/2023    7:55 AM 11/17/2022   11:21 AM  Depression screen PHQ 2/9  Decreased Interest 0 0 0  Down, Depressed, Hopeless 0 0 0  PHQ - 2 Score 0 0 0  Altered sleeping 0 0 0  Tired, decreased energy 0 0 0  Change in appetite 0 0 0  Feeling bad or failure about yourself  0 0 0  Trouble concentrating 0 0 0  Moving slowly or fidgety/restless 0 0 0  Suicidal thoughts 0 0 0  PHQ-9 Score 0 0 0  Difficult doing work/chores Not difficult at all Not difficult at all Not difficult at all    BP Readings from Last 3 Encounters:  07/27/23 124/72  02/14/23 120/68  11/17/22 122/62    Physical Exam Vitals and nursing note reviewed.  Constitutional:      General: She is not in acute distress.    Appearance: She is well-developed.  HENT:     Head: Normocephalic and atraumatic.  Cardiovascular:     Rate and Rhythm: Normal rate and regular rhythm.  Pulmonary:     Effort: Pulmonary effort is normal. No respiratory distress.     Breath sounds: No wheezing or rhonchi.  Musculoskeletal:      Right lower leg: No edema.     Left lower leg: No edema.  Skin:    General: Skin is warm and dry.     Capillary Refill: Capillary refill takes less than 2 seconds.     Findings: No rash.  Neurological:     General: No focal deficit present.     Mental  Status: She is alert and oriented to person, place, and time.  Psychiatric:        Mood and Affect: Mood normal.        Behavior: Behavior normal.     Wt Readings from Last 3 Encounters:  07/27/23 128 lb (58.1 kg)  02/14/23 131 lb 12.8 oz (59.8 kg)  11/17/22 135 lb (61.2 kg)    BP 124/72 (BP Location: Right Arm, Cuff Size: Normal)   Pulse 71   Ht 5' 6 (1.676 m)   Wt 128 lb (58.1 kg)   SpO2 100%   BMI 20.66 kg/m   Assessment and Plan:  Problem List Items Addressed This Visit       Unprioritized   Essential (primary) hypertension - Primary (Chronic)   Controlled BP with normal exam. Current regimen is Valsartan , hydrochlorothiazide  and Bystolic . Will continue same medications; encourage continued reduced sodium diet.       Esophagitis, reflux (Chronic)   Reflux symptoms are minimal on current therapy - Zegerid  otc nightly. No red flag signs such as weight loss, n/v, melena       Collagenous colitis   Only using budesonide  as needed - about 4 doses this past year. Not currently seeing GI Last Colonoscopy 2019.       No follow-ups on file.    Leita HILARIO Adie, MD [facility] Medicine [place]

## 2023-07-27 NOTE — Assessment & Plan Note (Signed)
 Controlled BP with normal exam. Current regimen is Valsartan, hydrochlorothiazide and Bystolic. Will continue same medications; encourage continued reduced sodium diet.

## 2023-07-27 NOTE — Assessment & Plan Note (Signed)
 Only using budesonide as needed - about 4 doses this past year. Not currently seeing GI Last Colonoscopy 2019.

## 2023-07-27 NOTE — Assessment & Plan Note (Signed)
 Reflux symptoms are minimal on current therapy - Zegerid otc nightly. No red flag signs such as weight loss, n/v, melena

## 2023-07-28 NOTE — Telephone Encounter (Signed)
 Requested Prescriptions  Pending Prescriptions Disp Refills   valsartan  (DIOVAN ) 320 MG tablet [Pharmacy Med Name: Valsartan  Oral Tablet 320 MG] 90 tablet 1    Sig: TAKE 1 TABLET EVERY DAY     Cardiovascular:  Angiotensin Receptor Blockers Passed - 07/28/2023  9:42 AM      Passed - Cr in normal range and within 180 days    Creatinine, Ser  Date Value Ref Range Status  02/14/2023 0.75 0.57 - 1.00 mg/dL Final         Passed - K in normal range and within 180 days    Potassium  Date Value Ref Range Status  02/14/2023 5.1 3.5 - 5.2 mmol/L Final         Passed - Patient is not pregnant      Passed - Last BP in normal range    BP Readings from Last 1 Encounters:  07/27/23 124/72         Passed - Valid encounter within last 6 months    Recent Outpatient Visits           Yesterday Essential (primary) hypertension   [place] [facility] at [facility], Leita DEL, MD   5 months ago Annual physical exam   [facility] Medicine at [facility], Leita DEL, MD   8 months ago Collagenous colitis   [facility] Primary Care & Sports Medicine at [facility], Leita DEL, MD   10 months ago Essential (primary) hypertension   [place] [facility] Medicine at [facility], Leita DEL, MD   1 year ago Annual physical exam   [facility] Medicine at [facility], Leita DEL, MD       Future Appointments             In 6 months Justus, Leita DEL, MD [facility] Health Primary Care & Sports Medicine at [facility], [facility]

## 2023-09-15 ENCOUNTER — Other Ambulatory Visit: Payer: Self-pay | Admitting: Internal Medicine

## 2023-09-15 DIAGNOSIS — K52831 Collagenous colitis: Secondary | ICD-10-CM

## 2023-09-15 NOTE — Telephone Encounter (Signed)
 Copied from CRM [id number]. Topic: Clinical - Medication Refill >> Sep 15, 2023 10:02 AM Gildardo Pounds wrote: Most Recent Primary Care Visit:  Provider: Reubin Milan  Department: [facility] [place]  Visit Type: OFFICE VISIT  Date: 07/27/2023  Medication: budesonide (ENTOCORT EC) 3 MG 24 hr capsule  Has the patient contacted their pharmacy? No (Agent: If no, request that the patient contact the pharmacy for the refill. If patient does not wish to contact the pharmacy document the reason why and proceed with request.) (Agent: If yes, when and what did the pharmacy advise?)  Is this the correct pharmacy for this prescription? Yes If no, delete pharmacy and type the correct one.  This is the patient's preferred pharmacy:  [id number], [place] - [address] AT [facility] [address] [place] [address] Phone: [phone] Fax: [phone]     Has the prescription been filled recently? No  Is the patient out of the medication? Yes  Has the patient been seen for an appointment in the last year OR does the patient have an upcoming appointment? Yes  Can we respond through MyChart? No  Agent: Please be advised that Rx refills may take up to 3 business days. We ask that you follow-up with your pharmacy.

## 2023-09-22 ENCOUNTER — Other Ambulatory Visit: Payer: Self-pay | Admitting: Internal Medicine

## 2023-09-22 DIAGNOSIS — K52831 Collagenous colitis: Secondary | ICD-10-CM

## 2023-09-22 NOTE — Telephone Encounter (Signed)
 Copied from CRM [id number]. Topic: Clinical - Medication Refill >> Sep 22, 2023  8:54 AM Higinio Roger wrote: Most Recent Primary Care Visit:  Provider: Reubin Milan   Department: [facility] [place]   Visit Type: OFFICE VISIT   Date: 07/27/2023   Medication: budesonide (ENTOCORT EC) 3 MG 24 hr capsule   Has the patient contacted their pharmacy? Yes  (Agent: If no, request that the patient contact the pharmacy for the refill. If patient does not wish to contact the pharmacy document the reason why and proceed with request.)  (Agent: If yes, when and what did the pharmacy advise?)  Contact Dr. Isidore Moos again Is this the correct pharmacy for this prescription? Yes  If no, delete pharmacy and type the correct one.  This is the patient's preferred pharmacy:  [facility]ORE #[id number], [place] - [address] AT [facility] [address] [place] [address] Phone: [phone] Fax: [phone]   Has the prescription been filled recently? No   Is the patient out of the medication? Yes   Has the patient been seen for an appointment in the last year OR does the patient have an upcoming appointment? Yes  Can we respond through MyChart? No. Call: [phone]  Agent: Please be advised that Rx refills may take up to 3 business days. We ask that you follow-up with your pharmacy.

## 2023-09-25 ENCOUNTER — Ambulatory Visit (INDEPENDENT_AMBULATORY_CARE_PROVIDER_SITE_OTHER): Payer: Medicare PPO | Admitting: Internal Medicine

## 2023-09-25 ENCOUNTER — Encounter: Payer: Self-pay | Admitting: Internal Medicine

## 2023-09-25 VITALS — BP 134/66 | HR 71 | Ht 66.0 in | Wt 127.5 lb

## 2023-09-25 DIAGNOSIS — I10 Essential (primary) hypertension: Secondary | ICD-10-CM

## 2023-09-25 DIAGNOSIS — K52831 Collagenous colitis: Secondary | ICD-10-CM | POA: Diagnosis not present

## 2023-09-25 MED ORDER — BUDESONIDE 3 MG PO CPEP
3.0000 mg | ORAL_CAPSULE | Freq: Three times a day (TID) | ORAL | 1 refills | Status: DC | PRN
Start: 2023-09-25 — End: 2023-11-03

## 2023-09-25 NOTE — Progress Notes (Signed)
 Date:  09/25/2023   Name:  Sharon Ayers   DOB:  [birth year]   MRN:  [id number]   Chief Complaint: Hypertension  Hypertension This is a chronic problem. The problem is controlled. Pertinent negatives include no chest pain, headaches, palpitations or shortness of breath. Past treatments include angiotensin blockers, beta blockers and diuretics. The current treatment provides significant improvement.  She had very high readings at [employer] last week.  At home since then it is improved,  she feels well, is taking her medications daily and has not had a change in diet or activity.  Review of Systems  Constitutional:  Negative for fatigue and unexpected weight change.  HENT:  Negative for trouble swallowing.   Eyes:  Negative for visual disturbance.  Respiratory:  Negative for cough, chest tightness, shortness of breath and wheezing.   Cardiovascular:  Negative for chest pain, palpitations and leg swelling.  Gastrointestinal:  Negative for abdominal pain, constipation and diarrhea.  Musculoskeletal:  Negative for arthralgias and myalgias.  Neurological:  Negative for dizziness, weakness, light-headedness and headaches.     Lab Results  Component Value Date   NA 136 02/14/2023   K 5.1 02/14/2023   CO2 25 02/14/2023   GLUCOSE 102 (H) 02/14/2023   BUN 12 02/14/2023   CREATININE 0.75 02/14/2023   CALCIUM 9.9 02/14/2023   EGFR 86 02/14/2023   GFRNONAA 85 01/24/2020   Lab Results  Component Value Date   CHOL 226 (H) 02/14/2023   HDL 108 02/14/2023   LDLCALC 109 (H) 02/14/2023   TRIG 53 02/14/2023   CHOLHDL 2.1 02/14/2023   Lab Results  Component Value Date   TSH 1.060 02/14/2023   No results found for: "HGBA1C" Lab Results  Component Value Date   WBC 7.7 02/14/2023   HGB 13.4 02/14/2023   HCT 40.1 02/14/2023   MCV 96 02/14/2023   PLT 284 02/14/2023   Lab Results  Component Value Date   ALT 16 02/14/2023   AST 20 02/14/2023   ALKPHOS 76 02/14/2023   BILITOT 0.4  02/14/2023   Lab Results  Component Value Date   VD25OH 69.3 02/09/2022     Patient Active Problem List   Diagnosis Date Noted   Collagenous colitis 07/25/2017   Dyslipidemia 05/27/2015   Essential (primary) hypertension 05/27/2015   Generalized OA 05/27/2015   Esophagitis, reflux 05/27/2015   OP (osteoporosis) 05/27/2015   History of right breast cancer 03/12/2014   Abnormal Pap smear of cervix 03/26/2013   Carotid artery narrowing 11/27/2012    Allergies  Allergen Reactions   Diclofenac Diarrhea and Nausea And Vomiting   Ace Inhibitors Cough and Other (See Comments)    Other reaction(s): Cough   Atorvastatin Other (See Comments)    Severe back pain Severe back pain   Olmesartan Diarrhea    Past Surgical History:  Procedure Laterality Date   BILATERAL TOTAL MASTECTOMY WITH AXILLARY LYMPH NODE DISSECTION  05/2014   DCIS right breast ER/PR-   CAROTID ENDARTERECTOMY  2009   COLONOSCOPY  2008    Social History   Tobacco Use   Smoking status: Never   Smokeless tobacco: Never  Vaping Use   Vaping status: Never Used  Substance Use Topics   Alcohol use: Yes    Alcohol/week: 2.0 standard drinks of alcohol    Types: 2 Standard drinks or equivalent per week   Drug use: Never     Medication list has been reviewed and updated.  Current Meds  Medication  Sig   Ascorbic Acid (VITAMIN C) 1000 MG tablet Take 1,000 mg by mouth daily.   aspirin 325 MG tablet Take 1 tablet by mouth daily.   calcium carbonate (OSCAL) 1500 (600 Ca) MG TABS tablet Take by mouth 2 (two) times daily with a meal.   ELDERBERRY PO Take by mouth in the morning and at bedtime.   hydrochlorothiazide (MICROZIDE) 12.5 MG capsule Take 1 capsule (12.5 mg total) by mouth daily.   MULTIPLE VITAMIN PO Take by mouth.   nebivolol (BYSTOLIC) 5 MG tablet TAKE 1 TABLET EVERY DAY   Omega-3 Fatty Acids (FISH OIL BURP-LESS) 1000 MG CAPS Take 1 capsule by mouth daily.   Omeprazole-Sodium Bicarbonate (ZEGERID OTC)  20-1100 MG CAPS capsule Take 1 capsule by mouth daily.   pravastatin (PRAVACHOL) 20 MG tablet Take 1 tablet (20 mg total) by mouth daily.   valsartan (DIOVAN) 320 MG tablet TAKE 1 TABLET EVERY DAY   [DISCONTINUED] budesonide (ENTOCORT EC) 3 MG 24 hr capsule Take 1 capsule (3 mg total) by mouth 3 (three) times daily as needed (Collagenous Colitis).       09/25/2023    9:36 AM 07/27/2023    9:36 AM 02/14/2023    7:56 AM 11/17/2022   11:21 AM  GAD 7 : Generalized Anxiety Score  Nervous, Anxious, on Edge 0 0 0 0  Control/stop worrying 0 0 0 0  Worry too much - different things 0 0 0 0  Trouble relaxing 0 0 0 0  Restless 0 0 0 0  Easily annoyed or irritable 0 0 0 0  Afraid - awful might happen 0 0 0 0  Total GAD 7 Score 0 0 0 0  Anxiety Difficulty Not difficult at all Not difficult at all Not difficult at all Not difficult at all       09/25/2023    9:36 AM 07/27/2023    9:36 AM 02/14/2023    7:55 AM  Depression screen PHQ 2/9  Decreased Interest 0 0 0  Down, Depressed, Hopeless 0 0 0  PHQ - 2 Score 0 0 0  Altered sleeping 0 0 0  Tired, decreased energy 0 0 0  Change in appetite 0 0 0  Feeling bad or failure about yourself  0 0 0  Trouble concentrating 0 0 0  Moving slowly or fidgety/restless 0 0 0  Suicidal thoughts 0 0 0  PHQ-9 Score 0 0 0  Difficult doing work/chores Not difficult at all Not difficult at all Not difficult at all    BP Readings from Last 3 Encounters:  09/25/23 134/66  07/27/23 124/72  02/14/23 120/68    Physical Exam Vitals and nursing note reviewed.  Constitutional:      General: She is not in acute distress.    Appearance: Normal appearance. She is well-developed.  HENT:     Head: Normocephalic and atraumatic.  Neck:     Vascular: No carotid bruit.  Cardiovascular:     Rate and Rhythm: Normal rate and regular rhythm.  Pulmonary:     Effort: Pulmonary effort is normal. No respiratory distress.     Breath sounds: No wheezing or rhonchi.   Musculoskeletal:     Right lower leg: No edema.     Left lower leg: No edema.  Lymphadenopathy:     Cervical: No cervical adenopathy.  Skin:    General: Skin is warm and dry.     Findings: No rash.  Neurological:     Mental Status: She  is alert and oriented to person, place, and time.  Psychiatric:        Mood and Affect: Mood normal.        Behavior: Behavior normal.     Wt Readings from Last 3 Encounters:  09/25/23 127 lb 8 oz (57.8 kg)  07/27/23 128 lb (58.1 kg)  02/14/23 131 lb 12.8 oz (59.8 kg)    BP 134/66   Pulse 71   Ht 5\' 6"  (1.676 m)   Wt 127 lb 8 oz (57.8 kg)   SpO2 99%   BMI 20.58 kg/m   Assessment and Plan:  Problem List Items Addressed This Visit       Unprioritized   Essential (primary) hypertension - Primary (Chronic)   Controlled BP with normal exam.  Recent mild elevation is likely transient.  She will continue current medications and home monitoring. Current regimen is Diovan, Bystolic and HCTZ. Will continue same medications - follow up sooner than July if needed.      Collagenous colitis   Relevant Medications   budesonide (ENTOCORT EC) 3 MG 24 hr capsule    No follow-ups on file.    Reubin Milan, MD [facility] Primary Care and Sports Medicine [place]

## 2023-09-25 NOTE — Assessment & Plan Note (Addendum)
 Controlled BP with normal exam.  Recent mild elevation is likely transient.  She will continue current medications and home monitoring. Current regimen is Diovan, Bystolic and HCTZ. Will continue same medications - follow up sooner than July if needed.

## 2023-09-29 ENCOUNTER — Other Ambulatory Visit: Payer: Self-pay | Admitting: Internal Medicine

## 2023-09-29 DIAGNOSIS — I10 Essential (primary) hypertension: Secondary | ICD-10-CM

## 2023-09-29 NOTE — Telephone Encounter (Signed)
 Requested medication (s) are due for refill today: yes (mail order)  Requested medication (s) are on the active medication list: yes  Last refill:  07/18/23 #90  Future visit scheduled: yes  Notes to clinic:  overdue labs   Requested Prescriptions  Pending Prescriptions Disp Refills   hydrochlorothiazide (MICROZIDE) 12.5 MG capsule [Pharmacy Med Name: hydroCHLOROthiazide Oral Capsule 12.5 MG] 90 capsule 3    Sig: TAKE 1 CAPSULE EVERY DAY     Cardiovascular: Diuretics - Thiazide Failed - 09/29/2023  1:26 PM      Failed - Cr in normal range and within 180 days    Creatinine, Ser  Date Value Ref Range Status  02/14/2023 0.75 0.57 - 1.00 mg/dL Final         Failed - K in normal range and within 180 days    Potassium  Date Value Ref Range Status  02/14/2023 5.1 3.5 - 5.2 mmol/L Final         Failed - Na in normal range and within 180 days    Sodium  Date Value Ref Range Status  02/14/2023 136 134 - 144 mmol/L Final         Passed - Last BP in normal range    BP Readings from Last 1 Encounters:  09/25/23 134/66         Passed - Valid encounter within last 6 months    Recent Outpatient Visits           2 months ago Essential (primary) hypertension   [place] [facility] at [facility], Nyoka Cowden, MD   7 months ago Annual physical exam   [facility] Medicine at [facility], Nyoka Cowden, MD   10 months ago Collagenous colitis   [facility] Primary Care & Sports Medicine at [facility], Nyoka Cowden, MD   1 year ago Essential (primary) hypertension   [place] [facility] Medicine at [facility], Nyoka Cowden, MD   1 year ago Annual physical exam   [facility] Primary Care & Sports Medicine at [facility], Nyoka Cowden, MD       Future Appointments             In 4 months Judithann Graves, Nyoka Cowden, MD [facility] Health Primary Care & Sports Medicine at [facility], [facility]

## 2023-10-06 ENCOUNTER — Other Ambulatory Visit: Payer: Self-pay

## 2023-10-06 DIAGNOSIS — I10 Essential (primary) hypertension: Secondary | ICD-10-CM

## 2023-10-06 MED ORDER — HYDROCHLOROTHIAZIDE 12.5 MG PO CAPS: 12.5000 mg | ORAL_CAPSULE | Freq: Every day | ORAL | 1 refills | Status: DC

## 2023-11-03 ENCOUNTER — Other Ambulatory Visit: Payer: Self-pay | Admitting: Internal Medicine

## 2023-11-03 ENCOUNTER — Telehealth: Payer: Self-pay

## 2023-11-03 DIAGNOSIS — K52831 Collagenous colitis: Secondary | ICD-10-CM

## 2023-11-03 MED ORDER — BUDESONIDE 3 MG PO CPEP: 3.0000 mg | ORAL_CAPSULE | Freq: Three times a day (TID) | ORAL | 1 refills | Status: DC | PRN

## 2023-11-03 NOTE — Telephone Encounter (Signed)
 Copied from CRM [id number]. Topic: Clinical - Medication Refill >> Nov 03, 2023 11:38 AM Gery Pray wrote: Most Recent Primary Care Visit:  Provider: Reubin Milan  Department: [facility] [place]  Visit Type: OFFICE VISIT  Date: 09/25/2023  Medication: budesonide (ENTOCORT EC) 3 MG 24 hr capsule  Has the patient contacted their pharmacy? No (Agent: If no, request that the patient contact the pharmacy for the refill. If patient does not wish to contact the pharmacy document the reason why and proceed with request.) (Agent: If yes, when and what did the pharmacy advise?) No refills  Is this the correct pharmacy for this prescription? Yes If no, delete pharmacy and type the correct one.  This is the patient's preferred pharmacy:  [facility] #[id number] [facility], [place] - [address] AT [facility] [address] [place] [address] Phone: [phone] Fax: [phone]   Has the prescription been filled recently? No  Is the patient out of the medication? No 4 days remaining  Has the patient been seen for an appointment in the last year OR does the patient have an upcoming appointment? Yes  Can we respond through MyChart? No  Agent: Please be advised that Rx refills may take up to 3 business days. We ask that you follow-up with your pharmacy.

## 2023-11-03 NOTE — Telephone Encounter (Signed)
 Please review.  KP  Copied from CRM [id number]. Topic: Clinical - Medication Question >> Nov 03, 2023 11:36 AM Gery Pray wrote: Reason for CRM: Patient states that she had a flare up and she is needing more of the medication budesonide (ENTOCORT EC) 3 MG 24 hr capsule. Please contact patient at [phone]

## 2023-11-22 ENCOUNTER — Ambulatory Visit: Payer: Medicare PPO | Admitting: Emergency Medicine

## 2023-11-22 VITALS — Ht 63.0 in | Wt 125.0 lb

## 2023-11-22 DIAGNOSIS — Z0000 Encounter for general adult medical examination without abnormal findings: Secondary | ICD-10-CM

## 2023-11-22 NOTE — Patient Instructions (Addendum)
 Sharon Ayers , Thank you for taking time to come for your Medicare Wellness Visit. I appreciate your ongoing commitment to your health goals. Please review the following plan we discussed and let me know if I can assist you in the future.   Referrals/Orders/Follow-Ups/Clinician Recommendations: Check with your pharmacy about getting a tetanus shot to see if insurance will cover.  This is a list of the screening recommended for you and due dates:  Health Maintenance  Topic Date Due   DTaP/Tdap/Td vaccine (2 - Td or Tdap) 11/02/2022   COVID-19 Vaccine (6 - 2024-25 season) 03/26/2023   Flu Shot  02/23/2024   Medicare Annual Wellness Visit  11/21/2024   DEXA scan (bone density measurement)  02/23/2025   Colon Cancer Screening  03/20/2028   Pneumonia Vaccine  Completed   Zoster (Shingles) Vaccine  Completed   Hepatitis C Screening  Addressed   HPV Vaccine  Aged Out   Meningitis B Vaccine  Aged Out    Advanced directives: (Copy Requested) Please bring a copy of your health care power of attorney and living will to the office to be added to your chart at your convenience. You can mail to [facility] [address] [place], [place] [address] or email to [email or web address]  Next Medicare Annual Wellness Visit scheduled for next year: Yes, 11/27/24 @ 8:50am (phone visit)

## 2023-11-22 NOTE — Progress Notes (Signed)
 Subjective:   Sharon Ayers is a 73 y.o. who presents for a Medicare Wellness preventive visit.  Visit Complete: Virtual I connected with  Sharon Ayers on 11/22/23 by a audio enabled telemedicine application and verified that I am speaking with the correct person using two identifiers.  Patient Location: Home  Provider Location: Home Office  I discussed the limitations of evaluation and management by telemedicine. The patient expressed understanding and agreed to proceed.  Vital Signs: Because this visit was a virtual/telehealth visit, some criteria may be missing or patient reported. Any vitals not documented were not able to be obtained and vitals that have been documented are patient reported.  VideoDeclined- This patient declined Librarian, academic. Therefore the visit was completed with audio only.  Persons Participating in Visit: Patient.  AWV Questionnaire: No: Patient Medicare AWV questionnaire was not completed prior to this visit.  Cardiac Risk Factors include: advanced age (>5men, >17 women);dyslipidemia;hypertension     Objective:    Today's Vitals   11/22/23 0848  Weight: 125 lb (56.7 kg)  Height: 5\' 3"  (1.6 m)   Body mass index is 22.14 kg/m.     11/22/2023    8:59 AM 11/16/2022    9:02 AM 11/08/2021   10:57 AM 11/04/2020   12:56 PM 12/02/2015    8:26 AM  Advanced Directives  Does Patient Have a Medical Advance Directive? Yes No Yes Yes No  Type of Estate agent of [place];Living will  Healthcare Power of [place];Living will Healthcare Power of [place];Living will   Does patient want to make changes to medical advance directive? No - Patient declined      Copy of Healthcare Power of Attorney in Chart? No - copy requested  No - copy requested No - copy requested   Would patient like information on creating a medical advance directive?  No - Patient declined       Current Medications  (verified) Outpatient Encounter Medications as of 11/22/2023  Medication Sig   Ascorbic Acid (VITAMIN C) 1000 MG tablet Take 1,000 mg by mouth daily.   aspirin 325 MG tablet Take 1 tablet by mouth daily.   budesonide  (ENTOCORT EC ) 3 MG 24 hr capsule Take 1 capsule (3 mg total) by mouth 3 (three) times daily as needed (Collagenous Colitis).   calcium carbonate (OSCAL) 1500 (600 Ca) MG TABS tablet Take by mouth 2 (two) times daily with a meal.   ELDERBERRY PO Take by mouth in the morning and at bedtime.   hydrochlorothiazide  (MICROZIDE ) 12.5 MG capsule Take 1 capsule (12.5 mg total) by mouth daily.   MULTIPLE VITAMIN PO Take by mouth.   nebivolol  (BYSTOLIC ) 5 MG tablet TAKE 1 TABLET EVERY DAY   Omega-3 Fatty Acids (FISH OIL BURP-LESS) 1000 MG CAPS Take 1 capsule by mouth daily.   Omeprazole -Sodium Bicarbonate  (ZEGERID  OTC) 20-1100 MG CAPS capsule Take 1 capsule by mouth daily.   pravastatin  (PRAVACHOL ) 20 MG tablet Take 1 tablet (20 mg total) by mouth daily.   valsartan  (DIOVAN ) 320 MG tablet TAKE 1 TABLET EVERY DAY   No facility-administered encounter medications on file as of 11/22/2023.    Allergies (verified) Diclofenac, Ace inhibitors, Atorvastatin, and Olmesartan    History: Past Medical History:  Diagnosis Date   GERD (gastroesophageal reflux disease)    Hypertension    Past Surgical History:  Procedure Laterality Date   BILATERAL TOTAL MASTECTOMY WITH AXILLARY LYMPH NODE DISSECTION  05/2014   DCIS right breast ER/PR-  CAROTID ENDARTERECTOMY  2009   COLONOSCOPY  2008   Family History  Problem Relation Age of Onset   Hyperlipidemia Mother    Congestive Heart Failure Mother    Hypertension Father    Pulmonary fibrosis Father    Social History   Socioeconomic History   Marital status: Single    Spouse name: Not on file   Number of children: 0   Years of education: Not on file   Highest education level: Not on file  Occupational History   Occupation: retired   Tobacco Use   Smoking status: Never    Passive exposure: Past   Smokeless tobacco: Never  Vaping Use   Vaping status: Never Used  Substance and Sexual Activity   Alcohol use: Yes    Alcohol/week: 2.0 standard drinks of alcohol    Types: 2 Standard drinks or equivalent per week    Comment: 1 drink with dinner 1-2 times a week   Drug use: Never   Sexual activity: Not on file  Other Topics Concern   Not on file  Social History Narrative   Pt lives alone, never married, no children   Social Drivers of Corporate investment banker Strain: Low Risk  (11/22/2023)   Overall Financial Resource Strain (CARDIA)    Difficulty of Paying Living Expenses: Not hard at all  Food Insecurity: No Food Insecurity (11/22/2023)   Hunger Vital Sign    Worried About Running Out of Food in the Last Year: Never true    Ran Out of Food in the Last Year: Never true  Transportation Needs: No Transportation Needs (11/22/2023)   PRAPARE - Administrator, Civil Service (Medical): No    Lack of Transportation (Non-Medical): No  Physical Activity: Sufficiently Active (11/22/2023)   Exercise Vital Sign    Days of Exercise per Week: 5 days    Minutes of Exercise per Session: 40 min  Stress: No Stress Concern Present (11/22/2023)   [employer] of Occupational Health - Occupational Stress Questionnaire    Feeling of Stress : Not at all  Social Connections: Moderately Integrated (11/22/2023)   Social Connection and Isolation Panel [NHANES]    Frequency of Communication with Friends and Family: More than three times a week    Frequency of Social Gatherings with Friends and Family: More than three times a week    Attends Religious Services: More than 4 times per year    Active Member of [employer] or Organizations: Yes    Attends Engineer, structural: More than 4 times per year    Marital Status: Never married    Tobacco Counseling Counseling given: Not Answered    Clinical  Intake:  Pre-visit preparation completed: Yes  Pain : No/denies pain     BMI - recorded: 22.14 Nutritional Status: BMI of 19-24  Normal Nutritional Risks: None Diabetes: No  No results found for: "HGBA1C"   How often do you need to have someone help you when you read instructions, pamphlets, or other written materials from your doctor or pharmacy?: 1 - Never  Interpreter Needed?: No  Information entered by :: Jaunita Messier, CMA   Activities of Daily Living     11/22/2023    8:49 AM  In your present state of health, do you have any difficulty performing the following activities:  Hearing? 0  Vision? 0  Difficulty concentrating or making decisions? 0  Walking or climbing stairs? 0  Dressing or bathing? 0  Doing errands, shopping?  0  Preparing Food and eating ? N  Using the Toilet? N  In the past six months, have you accidently leaked urine? N  Do you have problems with loss of bowel control? N  Managing your Medications? N  Managing your Finances? N  Housekeeping or managing your Housekeeping? N    Patient Care Team: Sheron Dixons, MD as PCP - General (Internal Medicine) Mellody Sprout, MD (Otolaryngology)  Indicate any recent Medical Services you may have received from other than [facility] providers in the past year (date may be approximate).     Assessment:   This is a routine wellness examination for Sharon Ayers.  Hearing/Vision screen Hearing Screening - Comments:: Denies hearing loss Vision Screening - Comments:: Gets eye exams, Dr. Debrah Fan, [place]   Goals Addressed             This Visit's Progress    Patient Stated       Maintain current health and activity level       Depression Screen     11/22/2023    8:57 AM 09/25/2023    9:36 AM 07/27/2023    9:36 AM 02/14/2023    7:55 AM 11/17/2022   11:21 AM 11/16/2022    9:01 AM 02/09/2022    9:56 AM  PHQ 2/9 Scores  PHQ - 2 Score 0 0 0 0 0 0 0  PHQ- 9 Score 0 0 0 0 0 0 0    Fall Risk     11/22/2023     9:00 AM 09/25/2023    9:36 AM 07/27/2023    9:36 AM 02/14/2023    7:55 AM 11/17/2022   11:21 AM  Fall Risk   Falls in the past year? 0 0 0 0 0  Number falls in past yr: 0 0 0 0 0  Injury with Fall? 0 0 0 0 0  Risk for fall due to : No Fall Risks No Fall Risks No Fall Risks No Fall Risks No Fall Risks  Follow up Falls prevention discussed;Falls evaluation completed Falls evaluation completed Falls evaluation completed Falls evaluation completed Falls evaluation completed    MEDICARE RISK AT HOME:  Medicare Risk at Home Any stairs in or around the home?: Yes (3 steps) If so, are there any without handrails?: No Home free of loose throw rugs in walkways, pet beds, electrical cords, etc?: Yes Adequate lighting in your home to reduce risk of falls?: Yes Life alert?: Yes Use of a cane, walker or w/c?: No Grab bars in the bathroom?: No Shower chair or bench in shower?: No Elevated toilet seat or a handicapped toilet?: Yes  TIMED UP AND GO:  Was the test performed?  No  Cognitive Function: 6CIT completed        11/22/2023    9:03 AM 11/16/2022    9:06 AM  6CIT Screen  What Year? 0 points 0 points  What month? 0 points 0 points  What time? 0 points 0 points  Count back from 20 0 points 0 points  Months in reverse 0 points 0 points  Repeat phrase 0 points 0 points  Total Score 0 points 0 points    Immunizations Immunization History  Administered Date(s) Administered   Fluad Quad(high Dose 65+) 04/22/2019, 05/15/2020, 05/26/2022   Influenza,inj,Quad PF,6+ Mos 06/02/2017, 05/26/2018   Influenza-Unspecified 05/17/2021, 05/14/2023   Moderna SARS-COV2 Booster Vaccination 12/04/2020, 07/23/2021   Moderna Sars-Covid-2 Vaccination 08/20/2019, 09/17/2019, 06/12/2020   PNEUMOCOCCAL CONJUGATE-20 02/14/2023   Pneumococcal Conjugate-13 08/14/2018  Pneumococcal Polysaccharide-23 11/25/2019   Tdap 11/01/2012   Zoster Recombinant(Shingrix) 07/08/2017, 11/06/2017    Screening Tests Health  Maintenance  Topic Date Due   DTaP/Tdap/Td (2 - Td or Tdap) 11/02/2022   COVID-19 Vaccine (6 - 2024-25 season) 03/26/2023   INFLUENZA VACCINE  02/23/2024   Medicare Annual Wellness (AWV)  11/21/2024   DEXA SCAN  02/23/2025   Colonoscopy  03/20/2028   Pneumonia Vaccine 39+ Years old  Completed   Zoster Vaccines- Shingrix  Completed   Hepatitis C Screening  Addressed   HPV VACCINES  Aged Out   Meningococcal B Vaccine  Aged Out    Health Maintenance  Health Maintenance Due  Topic Date Due   DTaP/Tdap/Td (2 - Td or Tdap) 11/02/2022   COVID-19 Vaccine (6 - 2024-25 season) 03/26/2023   Health Maintenance Items Addressed: See Nurse Notes  Additional Screening:  Vision Screening: Recommended annual ophthalmology exams for early detection of glaucoma and other disorders of the eye.  Dental Screening: Recommended annual dental exams for proper oral hygiene  Community Resource Referral / Chronic Care Management: CRR required this visit?  No   CCM required this visit?  No     Plan:     I have personally reviewed and noted the following in the patient's chart:   Medical and social history Use of alcohol, tobacco or illicit drugs  Current medications and supplements including opioid prescriptions. Patient is not currently taking opioid prescriptions. Functional ability and status Nutritional status Physical activity Advanced directives List of other physicians Hospitalizations, surgeries, and ER visits in previous 12 months Vitals Screenings to include cognitive, depression, and falls Referrals and appointments  In addition, I have reviewed and discussed with patient certain preventive protocols, quality metrics, and best practice recommendations. A written personalized care plan for preventive services as well as general preventive health recommendations were provided to patient.     Jaunita Messier, CMA   11/22/2023   After Visit Summary: (Declined) Due to this being a  telephonic visit, with patients personalized plan was offered to patient but patient Declined AVS at this time   Notes: Please refer to Routing Comments.

## 2023-11-24 ENCOUNTER — Other Ambulatory Visit: Payer: Self-pay | Admitting: Internal Medicine

## 2023-11-24 DIAGNOSIS — I10 Essential (primary) hypertension: Secondary | ICD-10-CM

## 2023-11-27 NOTE — Telephone Encounter (Signed)
 Requested Prescriptions  Pending Prescriptions Disp Refills   nebivolol  (BYSTOLIC ) 5 MG tablet [Pharmacy Med Name: Nebivolol  HCl Oral Tablet 5 MG] 90 tablet 0    Sig: TAKE 1 TABLET EVERY DAY     Cardiovascular: Beta Blockers 3 Passed - 11/27/2023  4:14 PM      Passed - Cr in normal range and within 360 days    Creatinine, Ser  Date Value Ref Range Status  02/14/2023 0.75 0.57 - 1.00 mg/dL Final         Passed - AST in normal range and within 360 days    AST  Date Value Ref Range Status  02/14/2023 20 0 - 40 IU/L Final         Passed - ALT in normal range and within 360 days    ALT  Date Value Ref Range Status  02/14/2023 16 0 - 32 IU/L Final         Passed - Last BP in normal range    BP Readings from Last 1 Encounters:  09/25/23 134/66         Passed - Last Heart Rate in normal range    Pulse Readings from Last 1 Encounters:  09/25/23 71         Passed - Valid encounter within last 6 months    Recent Outpatient Visits           2 months ago Essential (primary) hypertension   [place] [facility] at [facility], Chales [place], MD       Future Appointments             In 2 months Gala Jubilee, Chales [place], MD [facility] Primary Care & Sports Medicine at [facility], [facility]

## 2023-12-27 ENCOUNTER — Other Ambulatory Visit: Payer: Self-pay

## 2023-12-27 DIAGNOSIS — K52831 Collagenous colitis: Secondary | ICD-10-CM

## 2023-12-27 MED ORDER — BUDESONIDE 3 MG PO CPEP: 3.0000 mg | ORAL_CAPSULE | Freq: Three times a day (TID) | ORAL | 1 refills | Status: DC | PRN

## 2023-12-27 NOTE — Telephone Encounter (Signed)
 Called patient to let her know that her medication has been sent to [facility] pharmacy and she was okay with them going there. Will call back if she has any problems getting them at the [facility].

## 2023-12-27 NOTE — Telephone Encounter (Signed)
 Copied from CRM [id number]. Topic: Clinical - Prescription Issue >> Dec 27, 2023  8:05 AM Sharon Ayers wrote: Reason for CRM: Patient wants a longer prescription for budesonide  (ENTOCORT EC ) 3 MG 24 hr capsule sent to [facility] because the 10-day supply is not enough to handle the colitis. Callback number is [phone] or [phone] cell.

## 2024-01-01 ENCOUNTER — Other Ambulatory Visit: Payer: Self-pay | Admitting: Internal Medicine

## 2024-01-01 DIAGNOSIS — E785 Hyperlipidemia, unspecified: Secondary | ICD-10-CM

## 2024-01-01 DIAGNOSIS — I10 Essential (primary) hypertension: Secondary | ICD-10-CM

## 2024-01-02 NOTE — Telephone Encounter (Signed)
 Too soon for refill.  Requested Prescriptions  Pending Prescriptions Disp Refills   pravastatin  (PRAVACHOL ) 20 MG tablet [Pharmacy Med Name: Pravastatin  Sodium Oral Tablet 20 MG] 90 tablet 3    Sig: TAKE 1 TABLET EVERY DAY     Cardiovascular:  Antilipid - Statins Failed - 01/02/2024 11:22 AM      Failed - Lipid Panel in normal range within the last 12 months    Cholesterol, Total  Date Value Ref Range Status  02/14/2023 226 (H) 100 - 199 mg/dL Final   LDL Chol Calc (NIH)  Date Value Ref Range Status  02/14/2023 109 (H) 0 - 99 mg/dL Final   HDL  Date Value Ref Range Status  02/14/2023 108 >39 mg/dL Final   Triglycerides  Date Value Ref Range Status  02/14/2023 53 0 - 149 mg/dL Final         Passed - Patient is not pregnant      Passed - Valid encounter within last 12 months    Recent Outpatient Visits           3 months ago Essential (primary) hypertension   [place] [facility] at [facility], Chales [place], MD       Future Appointments             In 1 month Gala Jubilee, [place]ales [place], MD [facility] Primary Care & Sports Medicine at [facility], [facility]             valsartan  (DIOVAN ) 320 MG tablet [Pharmacy Med Name: Valsartan  Oral Tablet 320 MG] 90 tablet 3    Sig: TAKE 1 TABLET EVERY DAY     Cardiovascular:  Angiotensin Receptor Blockers Failed - 01/02/2024 11:22 AM      Failed - Cr in normal range and within 180 days    Creatinine, Ser  Date Value Ref Range Status  02/14/2023 0.75 0.57 - 1.00 mg/dL Final         Failed - K in normal range and within 180 days    Potassium  Date Value Ref Range Status  02/14/2023 5.1 3.5 - 5.2 mmol/L Final         Passed - Patient is not pregnant      Passed - Last BP in normal range    BP Readings from Last 1 Encounters:  09/25/23 134/66         Passed - Valid encounter within last 6 months    Recent Outpatient Visits           3 months ago Essential (primary) hypertension   [facility] at [facility], Chales [place], MD       Future Appointments             In 1 month Gala Jubilee, Chales [place], MD [facility] Primary [facility] Medicine at [facility], [facility]

## 2024-01-13 ENCOUNTER — Other Ambulatory Visit: Payer: Self-pay | Admitting: Internal Medicine

## 2024-01-13 DIAGNOSIS — E785 Hyperlipidemia, unspecified: Secondary | ICD-10-CM

## 2024-01-16 NOTE — Telephone Encounter (Signed)
 Requested Prescriptions  Pending Prescriptions Disp Refills   pravastatin  (PRAVACHOL ) 20 MG tablet [Pharmacy Med Name: Pravastatin  Sodium Oral Tablet 20 MG] 90 tablet 0    Sig: TAKE 1 TABLET EVERY DAY     Cardiovascular:  Antilipid - Statins Failed - 01/16/2024 12:06 PM      Failed - Lipid Panel in normal range within the last 12 months    Cholesterol, Total  Date Value Ref Range Status  02/14/2023 226 (H) 100 - 199 mg/dL Final   LDL Chol Calc (NIH)  Date Value Ref Range Status  02/14/2023 109 (H) 0 - 99 mg/dL Final   HDL  Date Value Ref Range Status  02/14/2023 108 >39 mg/dL Final   Triglycerides  Date Value Ref Range Status  02/14/2023 53 0 - 149 mg/dL Final         Passed - Patient is not pregnant      Passed - Valid encounter within last 12 months    Recent Outpatient Visits           3 months ago Essential (primary) hypertension   [place] [facility] at [facility], Leita DEL, MD       Future Appointments             In 1 month Justus, Leita DEL, MD [facility] & [facility] Medicine at [facility], [facility]

## 2024-01-20 ENCOUNTER — Other Ambulatory Visit: Payer: Self-pay | Admitting: Internal Medicine

## 2024-01-20 DIAGNOSIS — I10 Essential (primary) hypertension: Secondary | ICD-10-CM

## 2024-01-22 NOTE — Telephone Encounter (Signed)
 Requested Prescriptions  Pending Prescriptions Disp Refills   valsartan  (DIOVAN ) 320 MG tablet [Pharmacy Med Name: Valsartan  Oral Tablet 320 MG] 90 tablet 0    Sig: TAKE 1 TABLET EVERY DAY     Cardiovascular:  Angiotensin Receptor Blockers Failed - 01/22/2024  5:47 PM      Failed - Cr in normal range and within 180 days    Creatinine, Ser  Date Value Ref Range Status  02/14/2023 0.75 0.57 - 1.00 mg/dL Final         Failed - K in normal range and within 180 days    Potassium  Date Value Ref Range Status  02/14/2023 5.1 3.5 - 5.2 mmol/L Final         Passed - Patient is not pregnant      Passed - Last BP in normal range    BP Readings from Last 1 Encounters:  09/25/23 134/66         Passed - Valid encounter within last 6 months    Recent Outpatient Visits           3 months ago Essential (primary) hypertension   [place] [facility] at [facility], Leita DEL, MD       Future Appointments             In 3 weeks Justus, Leita DEL, MD [facility] Primary Care & Sports Medicine at [facility], [facility]

## 2024-02-14 ENCOUNTER — Other Ambulatory Visit: Payer: Self-pay | Admitting: Internal Medicine

## 2024-02-14 DIAGNOSIS — I10 Essential (primary) hypertension: Secondary | ICD-10-CM

## 2024-02-15 NOTE — Telephone Encounter (Signed)
 Requested medications are due for refill today.  yes  Requested medications are on the active medications list.  yes  Last refill. 11/27/2023 #90 0 rf  Future visit scheduled.   Yes - tomorrow  Notes to clinic.  Labs are expired    Requested Prescriptions  Pending Prescriptions Disp Refills   nebivolol  (BYSTOLIC ) 5 MG tablet [Pharmacy Med Name: NEBIVOLOL  HYDROCHLORIDE 5 MG Oral Tablet] 90 tablet 3    Sig: TAKE 1 TABLET EVERY DAY     Cardiovascular: Beta Blockers 3 Failed - 02/15/2024  4:26 PM      Failed - Cr in normal range and within 360 days    Creatinine, Ser  Date Value Ref Range Status  02/14/2023 0.75 0.57 - 1.00 mg/dL Final         Failed - AST in normal range and within 360 days    AST  Date Value Ref Range Status  02/14/2023 20 0 - 40 IU/L Final         Failed - ALT in normal range and within 360 days    ALT  Date Value Ref Range Status  02/14/2023 16 0 - 32 IU/L Final         Passed - Last BP in normal range    BP Readings from Last 1 Encounters:  09/25/23 134/66         Passed - Last Heart Rate in normal range    Pulse Readings from Last 1 Encounters:  09/25/23 71         Passed - Valid encounter within last 6 months    Recent Outpatient Visits           4 months ago Essential (primary) hypertension   [place] [facility] at [facility], Leita DEL, MD       Future Appointments             Tomorrow Justus Leita DEL, MD [facility] Primary Care & Sports Medicine at [facility], [facility]

## 2024-02-16 ENCOUNTER — Ambulatory Visit (INDEPENDENT_AMBULATORY_CARE_PROVIDER_SITE_OTHER): Payer: Self-pay | Admitting: Internal Medicine

## 2024-02-16 ENCOUNTER — Encounter: Payer: Self-pay | Admitting: Internal Medicine

## 2024-02-16 VITALS — BP 122/62 | HR 72 | Ht 63.0 in | Wt 128.0 lb

## 2024-02-16 DIAGNOSIS — M810 Age-related osteoporosis without current pathological fracture: Secondary | ICD-10-CM | POA: Diagnosis not present

## 2024-02-16 DIAGNOSIS — E785 Hyperlipidemia, unspecified: Secondary | ICD-10-CM

## 2024-02-16 DIAGNOSIS — Z0000 Encounter for general adult medical examination without abnormal findings: Secondary | ICD-10-CM

## 2024-02-16 DIAGNOSIS — I10 Essential (primary) hypertension: Secondary | ICD-10-CM

## 2024-02-16 DIAGNOSIS — K52831 Collagenous colitis: Secondary | ICD-10-CM

## 2024-02-16 DIAGNOSIS — Z853 Personal history of malignant neoplasm of breast: Secondary | ICD-10-CM

## 2024-02-16 DIAGNOSIS — I6523 Occlusion and stenosis of bilateral carotid arteries: Secondary | ICD-10-CM

## 2024-02-16 DIAGNOSIS — K2100 Gastro-esophageal reflux disease with esophagitis, without bleeding: Secondary | ICD-10-CM | POA: Diagnosis not present

## 2024-02-16 NOTE — Assessment & Plan Note (Signed)
 Blood pressure is well controlled on Diovan , hydrochlorothiazide  and bystolic . No medication side effects noted. Plan to continue current medications.

## 2024-02-16 NOTE — Patient Instructions (Signed)
Call [facility] to schedule your DEXA at [phone].

## 2024-02-16 NOTE — Assessment & Plan Note (Addendum)
 On statin therapy  Last US  2014 but she does [facility] screening yearly with no change No bruit on exam

## 2024-02-16 NOTE — Assessment & Plan Note (Signed)
 Doing well on endocort bid. Last colonoscopy 2019 - recommend repeat 10 yrs

## 2024-02-16 NOTE — Assessment & Plan Note (Signed)
 Reflux symptoms are controlled on Zegerid  daily. Patient denies red flag symptoms - no melena, weight loss, dysphagia.

## 2024-02-16 NOTE — Assessment & Plan Note (Signed)
 Last DEXA 2021 showed osteopenia. Continue vitamin D , will repeat DEXA

## 2024-02-16 NOTE — Assessment & Plan Note (Signed)
 LDL is  Lab Results  Component Value Date   LDLCALC 109 (H) 02/14/2023   Currently taking pravachol .  No medication side effects or other concerns. Recommended LDL goal is < 55.

## 2024-02-16 NOTE — Progress Notes (Signed)
 Date:  02/16/2024   Name:  Sharon Ayers   DOB:  03-08-1951   MRN:  [id number]   Chief Complaint: Annual Exam Sharon Ayers is a 73 y.o. female who presents today for her Complete Annual Exam. She feels well. She reports exercising. She reports she is sleeping well. Breast complaints -none.  Health Maintenance  Topic Date Due   COVID-19 Vaccine (6 - 2024-25 season) 03/26/2023   DTaP/Tdap/Td vaccine (2 - Td or Tdap) 02/15/2025*   Flu Shot  02/23/2024   Medicare Annual Wellness Visit  11/21/2024   DEXA scan (bone density measurement)  02/23/2025   Colon Cancer Screening  03/20/2028   Pneumococcal Vaccine for age over 67  Completed   Zoster (Shingles) Vaccine  Completed   Hepatitis C Screening  Addressed   Hepatitis B Vaccine  Aged Out   HPV Vaccine  Aged Out   Meningitis B Vaccine  Aged Out  *Topic was postponed. The date shown is not the original due date.    Hypertension This is a chronic problem. The problem is controlled. Pertinent negatives include no chest pain, headaches, palpitations or shortness of breath. Past treatments include angiotensin blockers, beta blockers and diuretics. The current treatment provides significant improvement. Hypertensive end-organ damage includes PVD. There is no history of CAD/MI.  Gastroesophageal Reflux She complains of heartburn. She reports no abdominal pain, no chest pain, no coughing or no wheezing. This is a recurrent problem. The problem occurs occasionally. Pertinent negatives include no fatigue. She has tried a PPI and an antacid for the symptoms. The treatment provided significant relief. Past procedures include an EGD.  Hyperlipidemia This is a chronic problem. The problem is controlled. Pertinent negatives include no chest pain, myalgias or shortness of breath. Current antihyperlipidemic treatment includes statins.    Review of Systems  Constitutional:  Negative for fatigue and unexpected weight change.  HENT:  Negative  for trouble swallowing.   Eyes:  Negative for visual disturbance.  Respiratory:  Negative for cough, chest tightness, shortness of breath and wheezing.   Cardiovascular:  Negative for chest pain, palpitations and leg swelling.  Gastrointestinal:  Positive for heartburn. Negative for abdominal pain, constipation and diarrhea.  Musculoskeletal:  Negative for arthralgias and myalgias.  Neurological:  Negative for dizziness, weakness, light-headedness and headaches.     Lab Results  Component Value Date   NA 136 02/14/2023   K 5.1 02/14/2023   CO2 25 02/14/2023   GLUCOSE 102 (H) 02/14/2023   BUN 12 02/14/2023   CREATININE 0.75 02/14/2023   CALCIUM 9.9 02/14/2023   EGFR 86 02/14/2023   GFRNONAA 85 01/24/2020   Lab Results  Component Value Date   CHOL 226 (H) 02/14/2023   HDL 108 02/14/2023   LDLCALC 109 (H) 02/14/2023   TRIG 53 02/14/2023   CHOLHDL 2.1 02/14/2023   Lab Results  Component Value Date   TSH 1.060 02/14/2023   No results found for: HGBA1C Lab Results  Component Value Date   WBC 7.7 02/14/2023   HGB 13.4 02/14/2023   HCT 40.1 02/14/2023   MCV 96 02/14/2023   PLT 284 02/14/2023   Lab Results  Component Value Date   ALT 16 02/14/2023   AST 20 02/14/2023   ALKPHOS 76 02/14/2023   BILITOT 0.4 02/14/2023   Lab Results  Component Value Date   VD25OH 69.3 02/09/2022     Patient Active Problem List   Diagnosis Date Noted   Collagenous colitis 07/25/2017   Dyslipidemia  05/27/2015   Essential (primary) hypertension 05/27/2015   Generalized OA 05/27/2015   Esophagitis, reflux 05/27/2015   OP (osteoporosis) 05/27/2015   History of right breast cancer 03/12/2014   Abnormal Pap smear of cervix 03/26/2013   Carotid artery narrowing 11/27/2012    Allergies  Allergen Reactions   Diclofenac Diarrhea and Nausea And Vomiting   Ace Inhibitors Cough and Other (See Comments)    Other reaction(s): Cough   Atorvastatin Other (See Comments)    Severe back  pain Severe back pain   Olmesartan  Diarrhea    Past Surgical History:  Procedure Laterality Date   BILATERAL TOTAL MASTECTOMY WITH AXILLARY LYMPH NODE DISSECTION  05/2014   DCIS right breast ER/PR-   CAROTID ENDARTERECTOMY  2009   COLONOSCOPY  2008    Social History   Tobacco Use   Smoking status: Never    Passive exposure: Past   Smokeless tobacco: Never  Vaping Use   Vaping status: Never Used  Substance Use Topics   Alcohol use: Yes    Alcohol/week: 2.0 standard drinks of alcohol    Types: 2 Standard drinks or equivalent per week    Comment: 1 drink with dinner 1-2 times a week   Drug use: Never     Medication list has been reviewed and updated.  Current Meds  Medication Sig   Ascorbic Acid (VITAMIN C) 1000 MG tablet Take 1,000 mg by mouth daily.   aspirin 325 MG tablet Take 1 tablet by mouth daily.   budesonide  (ENTOCORT EC ) 3 MG 24 hr capsule Take 1 capsule (3 mg total) by mouth 3 (three) times daily as needed (Collagenous Colitis).   calcium carbonate (OSCAL) 1500 (600 Ca) MG TABS tablet Take by mouth 2 (two) times daily with a meal.   ELDERBERRY PO Take by mouth in the morning and at bedtime.   hydrochlorothiazide  (MICROZIDE ) 12.5 MG capsule Take 1 capsule (12.5 mg total) by mouth daily.   MULTIPLE VITAMIN PO Take by mouth.   Omega-3 Fatty Acids (FISH OIL BURP-LESS) 1000 MG CAPS Take 1 capsule by mouth daily.   Omeprazole -Sodium Bicarbonate  (ZEGERID  OTC) 20-1100 MG CAPS capsule Take 1 capsule by mouth daily.   pravastatin  (PRAVACHOL ) 20 MG tablet TAKE 1 TABLET EVERY DAY   valsartan  (DIOVAN ) 320 MG tablet TAKE 1 TABLET EVERY DAY   [DISCONTINUED] nebivolol  (BYSTOLIC ) 5 MG tablet TAKE 1 TABLET EVERY DAY       02/16/2024    7:54 AM 09/25/2023    9:36 AM 07/27/2023    9:36 AM 02/14/2023    7:56 AM  GAD 7 : Generalized Anxiety Score  Nervous, Anxious, on Edge 0 0 0 0  Control/stop worrying 0 0 0 0  Worry too much - different things 0 0 0 0  Trouble relaxing 0 0 0 0   Restless 0 0 0 0  Easily annoyed or irritable 0 0 0 0  Afraid - awful might happen 0 0 0 0  Total GAD 7 Score 0 0 0 0  Anxiety Difficulty Not difficult at all Not difficult at all Not difficult at all Not difficult at all       02/16/2024    7:54 AM 11/22/2023    8:57 AM 09/25/2023    9:36 AM  Depression screen PHQ 2/9  Decreased Interest 0 0 0  Down, Depressed, Hopeless 0 0 0  PHQ - 2 Score 0 0 0  Altered sleeping 0 0 0  Tired, decreased energy 0 0 0  Change in appetite 0 0 0  Feeling bad or failure about yourself  0 0 0  Trouble concentrating 0 0 0  Moving slowly or fidgety/restless 0 0 0  Suicidal thoughts 0 0 0  PHQ-9 Score 0 0 0  Difficult doing work/chores Not difficult at all Not difficult at all Not difficult at all    BP Readings from Last 3 Encounters:  02/16/24 122/62  09/25/23 134/66  07/27/23 124/72    Physical Exam Vitals and nursing note reviewed.  Constitutional:      General: She is not in acute distress.    Appearance: She is well-developed.  HENT:     Head: Normocephalic and atraumatic.     Right Ear: Tympanic membrane and ear canal normal.     Left Ear: Tympanic membrane and ear canal normal.     Nose: Nose normal.     Right Sinus: No maxillary sinus tenderness.     Left Sinus: No maxillary sinus tenderness.     Mouth/Throat:     Pharynx: Oropharynx is clear.  Eyes:     General: No scleral icterus.       Right eye: No discharge.        Left eye: No discharge.     Conjunctiva/sclera: Conjunctivae normal.  Neck:     Thyroid: No thyromegaly.     Vascular: No carotid bruit.  Cardiovascular:     Rate and Rhythm: Normal rate and regular rhythm.     Pulses: Normal pulses.     Heart sounds: Normal heart sounds.  Pulmonary:     Effort: Pulmonary effort is normal. No respiratory distress.     Breath sounds: No wheezing.  Abdominal:     General: Bowel sounds are normal.     Palpations: Abdomen is soft.     Tenderness: There is no abdominal  tenderness.  Musculoskeletal:     Cervical back: Normal range of motion. No erythema.     Right lower leg: No edema.     Left lower leg: No edema.  Lymphadenopathy:     Cervical: No cervical adenopathy.  Skin:    General: Skin is warm and dry.     Capillary Refill: Capillary refill takes less than 2 seconds.     Findings: No rash.  Neurological:     General: No focal deficit present.     Mental Status: She is alert and oriented to person, place, and time.     Cranial Nerves: No cranial nerve deficit.     Sensory: No sensory deficit.     Deep Tendon Reflexes: Reflexes are normal and symmetric.  Psychiatric:        Attention and Perception: Attention normal.        Mood and Affect: Mood normal.        Behavior: Behavior normal.     Wt Readings from Last 3 Encounters:  02/16/24 128 lb (58.1 kg)  11/22/23 125 lb (56.7 kg)  09/25/23 127 lb 8 oz (57.8 kg)    BP 122/62   Pulse 72   Ht 5' 3 (1.6 m)   Wt 128 lb (58.1 kg)   SpO2 98%   BMI 22.67 kg/m   Assessment and Plan:  Problem List Items Addressed This Visit       Unprioritized   Carotid artery narrowing (Chronic)   On statin therapy  Last US  2014 but she does [facility] screening yearly with no change No bruit on exam  Collagenous colitis   Doing well on endocort bid. Last colonoscopy 2019 - recommend repeat 10 yrs      Dyslipidemia (Chronic)   LDL is  Lab Results  Component Value Date   LDLCALC 109 (H) 02/14/2023   Currently taking pravachol .  No medication side effects or other concerns. Recommended LDL goal is < 55.       Relevant Orders   Lipid panel   Esophagitis, reflux (Chronic)   Reflux symptoms are controlled on Zegerid  daily.. Patient denies red flag symptoms - no melena, weight loss, dysphagia.       Relevant Orders   CBC with Differential/Platelet   Essential (primary) hypertension (Chronic)   Blood pressure is well controlled on Diovan , hydrochlorothiazide  and bystolic . No  medication side effects noted. Plan to continue current medications.       Relevant Orders   Comprehensive metabolic panel with GFR   TSH   Urinalysis, Routine w reflex microscopic   History of right breast cancer   Relevant Orders   CBC with Differential/Platelet   Comprehensive metabolic panel with GFR   OP (osteoporosis) (Chronic)   Last DEXA 2021 showed osteopenia. Continue vitamin D , will repeat DEXA      Relevant Orders   DG Bone Density   Other Visit Diagnoses       Annual physical exam    -  Primary   rec Tdap with any injury colon due 2029       Return in about 6 months (around 08/18/2024) for HTN.    Sharon HILARIO Adie, MD [facility] [place]

## 2024-02-17 LAB — CBC WITH DIFFERENTIAL/PLATELET
Basophils Absolute: 0 x10E3/uL (ref 0.0–0.2)
Basos: 0 %
EOS (ABSOLUTE): 0.1 x10E3/uL (ref 0.0–0.4)
Eos: 1 %
Hematocrit: 43.1 % (ref 34.0–46.6)
Hemoglobin: 14.3 g/dL (ref 11.1–15.9)
Immature Grans (Abs): 0 x10E3/uL (ref 0.0–0.1)
Immature Granulocytes: 0 %
Lymphocytes Absolute: 2.6 x10E3/uL (ref 0.7–3.1)
Lymphs: 26 %
MCH: 32.1 pg (ref 26.6–33.0)
MCHC: 33.2 g/dL (ref 31.5–35.7)
MCV: 97 fL (ref 79–97)
Monocytes Absolute: 0.7 x10E3/uL (ref 0.1–0.9)
Monocytes: 7 %
Neutrophils Absolute: 6.6 x10E3/uL (ref 1.4–7.0)
Neutrophils: 66 %
Platelets: 305 x10E3/uL (ref 150–450)
RBC: 4.46 x10E6/uL (ref 3.77–5.28)
RDW: 12 % (ref 11.7–15.4)
WBC: 10.1 x10E3/uL (ref 3.4–10.8)

## 2024-02-17 LAB — COMPREHENSIVE METABOLIC PANEL WITH GFR
ALT: 21 IU/L (ref 0–32)
AST: 23 IU/L (ref 0–40)
Albumin: 4.9 g/dL (ref 3.9–4.9)
Alkaline Phosphatase: 98 IU/L (ref 44–121)
BUN/Creatinine Ratio: 27 (ref 12–28)
BUN: 23 mg/dL (ref 8–27)
Bilirubin Total: 0.6 mg/dL (ref 0.0–1.2)
CO2: 21 mmol/L (ref 20–29)
Calcium: 9.9 mg/dL (ref 8.7–10.3)
Chloride: 98 mmol/L (ref 96–106)
Creatinine, Ser: 0.84 mg/dL (ref 0.57–1.00)
Globulin, Total: 2.5 g/dL (ref 1.5–4.5)
Glucose: 85 mg/dL (ref 70–99)
Potassium: 4 mmol/L (ref 3.5–5.2)
Sodium: 139 mmol/L (ref 134–144)
Total Protein: 7.4 g/dL (ref 6.0–8.5)
eGFR: 75 mL/min/1.73 (ref >59)

## 2024-02-17 LAB — URINALYSIS, ROUTINE W REFLEX MICROSCOPIC
Bilirubin, UA: NEGATIVE
Glucose, UA: NEGATIVE
Ketones, UA: NEGATIVE
Leukocytes,UA: NEGATIVE
Nitrite, UA: NEGATIVE
Protein,UA: NEGATIVE
RBC, UA: NEGATIVE
Specific Gravity, UA: 1.01 (ref 1.005–1.030)
Urobilinogen, Ur: 0.2 mg/dL (ref 0.2–1.0)
pH, UA: 6.5 (ref 5.0–7.5)

## 2024-02-17 LAB — LIPID PANEL
Chol/HDL Ratio: 2 ratio (ref 0.0–4.4)
Cholesterol, Total: 272 mg/dL — ABNORMAL HIGH (ref 100–199)
HDL: 139 mg/dL (ref >39)
LDL Chol Calc (NIH): 122 mg/dL — ABNORMAL HIGH (ref 0–99)
Triglycerides: 72 mg/dL (ref 0–149)
VLDL Cholesterol Cal: 11 mg/dL (ref 5–40)

## 2024-02-17 LAB — TSH: TSH: 1.08 u[IU]/mL (ref 0.450–4.500)

## 2024-02-18 ENCOUNTER — Ambulatory Visit: Payer: Self-pay | Admitting: Internal Medicine

## 2024-02-23 ENCOUNTER — Telehealth: Payer: Self-pay | Admitting: Internal Medicine

## 2024-02-23 NOTE — Telephone Encounter (Signed)
 Copied from CRM [id number]. Topic: Clinical - Lab/Test Results >> Feb 23, 2024 10:22 AM Sharon Ayers wrote: Reason for CRM: Pt would like a copy of her lab results mailed to her, home address on file confirmed.

## 2024-02-23 NOTE — Telephone Encounter (Signed)
 Labs printed and mailed.  KP

## 2024-03-02 ENCOUNTER — Other Ambulatory Visit: Payer: Self-pay | Admitting: Internal Medicine

## 2024-03-02 DIAGNOSIS — I10 Essential (primary) hypertension: Secondary | ICD-10-CM

## 2024-03-05 NOTE — Telephone Encounter (Signed)
 Requested Prescriptions  Pending Prescriptions Disp Refills   hydrochlorothiazide  (MICROZIDE ) 12.5 MG capsule [Pharmacy Med Name: HYDROCHLOROTHIAZIDE  12.5 MG Oral Capsule] 90 capsule 0    Sig: TAKE 1 CAPSULE EVERY DAY     Cardiovascular: Diuretics - Thiazide Passed - 03/05/2024  3:43 PM      Passed - Cr in normal range and within 180 days    Creatinine, Ser  Date Value Ref Range Status  02/16/2024 0.84 0.57 - 1.00 mg/dL Final         Passed - K in normal range and within 180 days    Potassium  Date Value Ref Range Status  02/16/2024 4.0 3.5 - 5.2 mmol/L Final         Passed - Na in normal range and within 180 days    Sodium  Date Value Ref Range Status  02/16/2024 139 134 - 144 mmol/L Final         Passed - Last BP in normal range    BP Readings from Last 1 Encounters:  02/16/24 122/62         Passed - Valid encounter within last 6 months    Recent Outpatient Visits           2 weeks ago Annual physical exam   [facility] at [facility], Leita DEL, MD   5 months ago Essential (primary) hypertension   [facility] at [facility], Leita DEL, MD

## 2024-04-01 ENCOUNTER — Other Ambulatory Visit: Payer: Self-pay | Admitting: Internal Medicine

## 2024-04-01 DIAGNOSIS — E785 Hyperlipidemia, unspecified: Secondary | ICD-10-CM

## 2024-04-02 NOTE — Telephone Encounter (Signed)
 Lipid panel in date.  Requested Prescriptions  Pending Prescriptions Disp Refills   pravastatin  (PRAVACHOL ) 20 MG tablet [Pharmacy Med Name: PRAVASTATIN  SODIUM 20 MG Oral Tablet] 90 tablet 3    Sig: TAKE 1 TABLET EVERY DAY     Cardiovascular:  Antilipid - Statins Failed - 04/02/2024  2:46 PM      Failed - Lipid Panel in normal range within the last 12 months    Cholesterol, Total  Date Value Ref Range Status  02/16/2024 272 (H) 100 - 199 mg/dL Final   LDL Chol Calc (NIH)  Date Value Ref Range Status  02/16/2024 122 (H) 0 - 99 mg/dL Final   HDL  Date Value Ref Range Status  02/16/2024 139 >39 mg/dL Final   Triglycerides  Date Value Ref Range Status  02/16/2024 72 0 - 149 mg/dL Final         Passed - Patient is not pregnant      Passed - Valid encounter within last 12 months    Recent Outpatient Visits           1 month ago Annual physical exam   [place] [facility] at [facility], Leita DEL, MD   6 months ago Essential (primary) hypertension   [facility] at [facility], Leita DEL, MD

## 2024-04-10 ENCOUNTER — Other Ambulatory Visit: Payer: Self-pay | Admitting: Internal Medicine

## 2024-04-10 DIAGNOSIS — I10 Essential (primary) hypertension: Secondary | ICD-10-CM

## 2024-04-11 NOTE — Telephone Encounter (Signed)
 Requested Prescriptions  Pending Prescriptions Disp Refills   valsartan  (DIOVAN ) 320 MG tablet [Pharmacy Med Name: VALSARTAN  320 MG Oral Tablet] 90 tablet 1    Sig: TAKE 1 TABLET EVERY DAY     Cardiovascular:  Angiotensin Receptor Blockers Passed - 04/11/2024  3:41 PM      Passed - Cr in normal range and within 180 days    Creatinine, Ser  Date Value Ref Range Status  02/16/2024 0.84 0.57 - 1.00 mg/dL Final         Passed - K in normal range and within 180 days    Potassium  Date Value Ref Range Status  02/16/2024 4.0 3.5 - 5.2 mmol/L Final         Passed - Patient is not pregnant      Passed - Last BP in normal range    BP Readings from Last 1 Encounters:  02/16/24 122/62         Passed - Valid encounter within last 6 months    Recent Outpatient Visits           1 month ago Annual physical exam   [place] [facility] at [facility], Leita DEL, MD   6 months ago Essential (primary) hypertension   [facility] at [facility], Leita DEL, MD

## 2024-05-17 ENCOUNTER — Other Ambulatory Visit: Payer: Self-pay | Admitting: Internal Medicine

## 2024-05-17 DIAGNOSIS — I10 Essential (primary) hypertension: Secondary | ICD-10-CM

## 2024-05-18 NOTE — Telephone Encounter (Signed)
 Requested Prescriptions  Pending Prescriptions Disp Refills   hydrochlorothiazide  (MICROZIDE ) 12.5 MG capsule [Pharmacy Med Name: HYDROCHLOROTHIAZIDE  12.5 MG Oral Capsule] 90 capsule 0    Sig: TAKE 1 CAPSULE EVERY DAY     Cardiovascular: Diuretics - Thiazide Passed - 05/18/2024 10:45 AM      Passed - Cr in normal range and within 180 days    Creatinine, Ser  Date Value Ref Range Status  02/16/2024 0.84 0.57 - 1.00 mg/dL Final         Passed - K in normal range and within 180 days    Potassium  Date Value Ref Range Status  02/16/2024 4.0 3.5 - 5.2 mmol/L Final         Passed - Na in normal range and within 180 days    Sodium  Date Value Ref Range Status  02/16/2024 139 134 - 144 mmol/L Final         Passed - Last BP in normal range    BP Readings from Last 1 Encounters:  02/16/24 122/62         Passed - Valid encounter within last 6 months    Recent Outpatient Visits           3 months ago Annual physical exam   [facility] at [facility], Leita DEL, MD   7 months ago Essential (primary) hypertension   [facility] at [facility], Leita DEL, MD

## 2024-06-18 ENCOUNTER — Encounter: Payer: Self-pay | Admitting: Internal Medicine

## 2024-06-18 ENCOUNTER — Ambulatory Visit: Payer: Self-pay

## 2024-06-18 ENCOUNTER — Ambulatory Visit (INDEPENDENT_AMBULATORY_CARE_PROVIDER_SITE_OTHER): Admitting: Internal Medicine

## 2024-06-18 VITALS — BP 122/70 | HR 79 | Temp 98.0°F | Ht 63.0 in | Wt 134.0 lb

## 2024-06-18 DIAGNOSIS — J0100 Acute maxillary sinusitis, unspecified: Secondary | ICD-10-CM

## 2024-06-18 MED ORDER — AMOXICILLIN-POT CLAVULANATE 875-125 MG PO TABS: 1.0000 | ORAL_TABLET | Freq: Two times a day (BID) | ORAL | 0 refills | Status: AC

## 2024-06-18 NOTE — Patient Instructions (Addendum)
 Coricidin HBP - for congestion  Probiotic while taking antibiotics  Call [facility] to schedule your DEXA at [phone].

## 2024-06-18 NOTE — Telephone Encounter (Signed)
 FYI Only or Action Required?: FYI only for provider: appointment scheduled on 06/18/24.  Patient was last seen in primary care on 02/16/2024 by Justus Leita DEL, MD.  Called Nurse Triage reporting Otalgia and Sinusitis.  Symptoms began several weeks ago.  Interventions attempted: Rest, hydration, or home remedies.  Symptoms are: gradually worsening.  Triage Disposition: See Physician Within 24 Hours  Patient/caregiver understands and will follow disposition?: Yes          Copied from CRM #[id number]. Topic: Clinical - Red Word Triage >> Jun 18, 2024 10:42 AM Victoria B wrote: [facility] that prompted transfer to Nurse Triage: Patient has pain in ears, says sinus infection, ears pop, also says crud Reason for Disposition  Earache  Answer Assessment - Initial Assessment Questions 1. LOCATION: Where does it hurt?      Jaw bone throbbing, teeth hurting 2. ONSET: When did the sinus pain start?  (e.g., hours, days)      3 weeks ago, with worsening sinus sx yesterday 3. SEVERITY: How bad is the pain?   (Scale 0-10; or none, mild, moderate or severe)     Concern for sinus infection 4. RECURRENT SYMPTOM: Have you ever had sinus problems before? If Yes, ask: When was the last time? and What happened that time?      yes 5. NASAL CONGESTION: Is the nose blocked? If Yes, ask: Can you open it or must you breathe through your mouth?     N/a 6. NASAL DISCHARGE: Do you have discharge from your nose? If so ask, What color?     N/a 7. FEVER: Do you have a fever? If Yes, ask: What is it, how was it measured, and when did it start?      denies 8. OTHER SYMPTOMS: Do you have any other symptoms? (e.g., sore throat, cough, earache, difficulty breathing)     Earache, recent exposure to COVID 9. PREGNANCY: Is there any chance you are pregnant? When was your last menstrual period?     N/a  Protocols used: Sinus Pain or Congestion-A-AH

## 2024-06-18 NOTE — Telephone Encounter (Signed)
 Noted  Pt has appt.  KP

## 2024-06-18 NOTE — Progress Notes (Signed)
 Date:  06/18/2024   Name:  Sharon Ayers   DOB:  [birth year]   MRN:  [id number]   Chief Complaint: Covid Exposure (Patient went to [place]  3 weeks ago and one of her friends she went with tested positive for Covid when they returned. She has been sick since and this morning she woke up with ear pain in both ears and sinus pressure under her eyes. She has ear popping every time she swallows. )  Otalgia  There is pain in both ears. This is a new (but was sick with covid like sx previously resolved) problem. The current episode started today. There has been no fever. Pertinent negatives include no coughing, ear discharge, headaches or sore throat.  Sinusitis This is a new problem. The current episode started yesterday. There has been no fever. Associated symptoms include ear pain. Pertinent negatives include no chills, congestion, coughing, headaches or sore throat.    Review of Systems  Constitutional:  Negative for chills, fatigue and fever.  HENT:  Positive for ear pain. Negative for congestion, ear discharge and sore throat.   Respiratory:  Negative for cough and wheezing.   Neurological:  Negative for headaches.     Lab Results  Component Value Date   NA 139 02/16/2024   K 4.0 02/16/2024   CO2 21 02/16/2024   GLUCOSE 85 02/16/2024   BUN 23 02/16/2024   CREATININE 0.84 02/16/2024   CALCIUM 9.9 02/16/2024   EGFR 75 02/16/2024   GFRNONAA 85 01/24/2020   Lab Results  Component Value Date   CHOL 272 (H) 02/16/2024   HDL 139 02/16/2024   LDLCALC 122 (H) 02/16/2024   TRIG 72 02/16/2024   CHOLHDL 2.0 02/16/2024   Lab Results  Component Value Date   TSH 1.080 02/16/2024   No results found for: HGBA1C Lab Results  Component Value Date   WBC 10.1 02/16/2024   HGB 14.3 02/16/2024   HCT 43.1 02/16/2024   MCV 97 02/16/2024   PLT 305 02/16/2024   Lab Results  Component Value Date   ALT 21 02/16/2024   AST 23 02/16/2024   ALKPHOS 98 02/16/2024   BILITOT 0.6  02/16/2024   Lab Results  Component Value Date   VD25OH 69.3 02/09/2022     Patient Active Problem List   Diagnosis Date Noted   Collagenous colitis 07/25/2017   Dyslipidemia 05/27/2015   Essential (primary) hypertension 05/27/2015   Generalized OA 05/27/2015   Esophagitis, reflux 05/27/2015   OP (osteoporosis) 05/27/2015   History of right breast cancer 03/12/2014   Abnormal Pap smear of cervix 03/26/2013   Carotid artery narrowing 11/27/2012    Allergies  Allergen Reactions   Diclofenac Diarrhea and Nausea And Vomiting   Ace Inhibitors Cough and Other (See Comments)    Other reaction(s): Cough   Atorvastatin Other (See Comments)    Severe back pain Severe back pain   Olmesartan  Diarrhea    Past Surgical History:  Procedure Laterality Date   BILATERAL TOTAL MASTECTOMY WITH AXILLARY LYMPH NODE DISSECTION  05/2014   DCIS right breast ER/PR-   CAROTID ENDARTERECTOMY  2009   COLONOSCOPY  2008    Social History   Tobacco Use   Smoking status: Never    Passive exposure: Past   Smokeless tobacco: Never  Vaping Use   Vaping status: Never Used  Substance Use Topics   Alcohol use: Yes    Alcohol/week: 2.0 standard drinks of alcohol    Types: 2 Standard drinks  or equivalent per week    Comment: 1 drink with dinner 1-2 times a week   Drug use: Never     Medication list has been reviewed and updated.  Current Meds  Medication Sig   amoxicillin -clavulanate (AUGMENTIN ) 875-125 MG tablet Take 1 tablet by mouth 2 (two) times daily for 10 days.   Ascorbic Acid (VITAMIN C) 1000 MG tablet Take 1,000 mg by mouth daily.   aspirin 325 MG tablet Take 1 tablet by mouth daily.   budesonide  (ENTOCORT EC ) 3 MG 24 hr capsule Take 1 capsule (3 mg total) by mouth 3 (three) times daily as needed (Collagenous Colitis).   calcium carbonate (OSCAL) 1500 (600 Ca) MG TABS tablet Take by mouth 2 (two) times daily with a meal.   ELDERBERRY PO Take by mouth in the morning and at bedtime.    hydrochlorothiazide  (MICROZIDE ) 12.5 MG capsule TAKE 1 CAPSULE EVERY DAY   MULTIPLE VITAMIN PO Take by mouth.   nebivolol  (BYSTOLIC ) 5 MG tablet Take 1 tablet (5 mg total) by mouth daily.   Omega-3 Fatty Acids (FISH OIL BURP-LESS) 1000 MG CAPS Take 1 capsule by mouth daily.   Omeprazole -Sodium Bicarbonate  (ZEGERID  OTC) 20-1100 MG CAPS capsule Take 1 capsule by mouth daily.   pravastatin  (PRAVACHOL ) 20 MG tablet TAKE 1 TABLET EVERY DAY   valsartan  (DIOVAN ) 320 MG tablet TAKE 1 TABLET EVERY DAY       02/16/2024    7:54 AM 09/25/2023    9:36 AM 07/27/2023    9:36 AM 02/14/2023    7:56 AM  GAD 7 : Generalized Anxiety Score  Nervous, Anxious, on Edge 0 0 0 0  Control/stop worrying 0 0 0 0  Worry too much - different things 0 0 0 0  Trouble relaxing 0 0 0 0  Restless 0 0 0 0  Easily annoyed or irritable 0 0 0 0  Afraid - awful might happen 0 0 0 0  Total GAD 7 Score 0 0 0 0  Anxiety Difficulty Not difficult at all Not difficult at all Not difficult at all Not difficult at all       02/16/2024    7:54 AM 11/22/2023    8:57 AM 09/25/2023    9:36 AM  Depression screen PHQ 2/9  Decreased Interest 0 0 0  Down, Depressed, Hopeless 0 0 0  PHQ - 2 Score 0 0 0  Altered sleeping 0 0 0  Tired, decreased energy 0 0 0  Change in appetite 0 0 0  Feeling bad or failure about yourself  0 0 0  Trouble concentrating 0 0 0  Moving slowly or fidgety/restless 0 0 0  Suicidal thoughts 0 0 0  PHQ-9 Score 0  0  0   Difficult doing work/chores Not difficult at all Not difficult at all Not difficult at all     Data saved with a previous flowsheet row definition    BP Readings from Last 3 Encounters:  06/18/24 122/70  02/16/24 122/62  09/25/23 134/66    Physical Exam Vitals and nursing note reviewed.  Constitutional:      General: She is not in acute distress.    Appearance: Normal appearance. She is well-developed.  HENT:     Head: Normocephalic and atraumatic.     Ears:     Comments: Excessive  cerumen - the portion of the TMs visualized is normal.    Nose:     Right Sinus: Maxillary sinus tenderness and frontal sinus tenderness  present.     Left Sinus: Maxillary sinus tenderness and frontal sinus tenderness present.     Mouth/Throat:     Pharynx: No oropharyngeal exudate or posterior oropharyngeal erythema.  Cardiovascular:     Rate and Rhythm: Normal rate and regular rhythm.  Pulmonary:     Effort: Pulmonary effort is normal. No respiratory distress.     Breath sounds: No wheezing or rhonchi.  Musculoskeletal:     Cervical back: Normal range of motion.  Lymphadenopathy:     Cervical: No cervical adenopathy.  Skin:    General: Skin is warm and dry.     Findings: No rash.  Neurological:     Mental Status: She is alert and oriented to person, place, and time.  Psychiatric:        Mood and Affect: Mood normal.        Behavior: Behavior normal.     Wt Readings from Last 3 Encounters:  06/18/24 134 lb (60.8 kg)  02/16/24 128 lb (58.1 kg)  11/22/23 125 lb (56.7 kg)    BP 122/70   Pulse 79   Temp 98 F (36.7 C) (Oral)   Ht 5' 3 (1.6 m)   Wt 134 lb (60.8 kg)   SpO2 97%   BMI 23.74 kg/m   Assessment and Plan:  Problem List Items Addressed This Visit   None Visit Diagnoses       Acute non-recurrent maxillary sinusitis    -  Primary   recommend Coricidin HBP for sinus and ear congestion push fluids antibiotics   Relevant Medications   amoxicillin -clavulanate (AUGMENTIN ) 875-125 MG tablet       No follow-ups on file.    Leita HILARIO Adie, MD [facility] [place]

## 2024-07-22 ENCOUNTER — Other Ambulatory Visit: Payer: Self-pay | Admitting: Internal Medicine

## 2024-07-22 DIAGNOSIS — I10 Essential (primary) hypertension: Secondary | ICD-10-CM

## 2024-07-23 NOTE — Telephone Encounter (Signed)
 Requested Prescriptions  Pending Prescriptions Disp Refills   nebivolol  (BYSTOLIC ) 5 MG tablet [Pharmacy Med Name: NEBIVOLOL  HYDROCHLORIDE 5 MG Oral Tablet] 90 tablet 3    Sig: TAKE 1 TABLET EVERY DAY     Cardiovascular: Beta Blockers 3 Passed - 07/23/2024  3:37 PM      Passed - Cr in normal range and within 360 days    Creatinine, Ser  Date Value Ref Range Status  02/16/2024 0.84 0.57 - 1.00 mg/dL Final         Passed - AST in normal range and within 360 days    AST  Date Value Ref Range Status  02/16/2024 23 0 - 40 IU/L Final         Passed - ALT in normal range and within 360 days    ALT  Date Value Ref Range Status  02/16/2024 21 0 - 32 IU/L Final         Passed - Last BP in normal range    BP Readings from Last 1 Encounters:  06/18/24 122/70         Passed - Last Heart Rate in normal range    Pulse Readings from Last 1 Encounters:  06/18/24 79         Passed - Valid encounter within last 6 months    Recent Outpatient Visits           1 month ago Acute non-recurrent maxillary sinusitis   [facility], Leita DEL, MD   5 months ago Annual physical exam   [facility] at [facility], Leita DEL, MD   10 months ago Essential (primary) hypertension   [facility] at [facility], Leita DEL, MD

## 2024-08-05 ENCOUNTER — Other Ambulatory Visit: Payer: Self-pay

## 2024-08-05 DIAGNOSIS — I10 Essential (primary) hypertension: Secondary | ICD-10-CM

## 2024-08-05 MED ORDER — HYDROCHLOROTHIAZIDE 12.5 MG PO CAPS: 12.5000 mg | ORAL_CAPSULE | Freq: Every day | ORAL | 0 refills | Status: AC

## 2024-08-12 ENCOUNTER — Ambulatory Visit (INDEPENDENT_AMBULATORY_CARE_PROVIDER_SITE_OTHER): Admitting: Student

## 2024-08-12 ENCOUNTER — Encounter: Payer: Self-pay | Admitting: Student

## 2024-08-12 VITALS — BP 124/76 | HR 81 | Ht 63.0 in | Wt 133.0 lb

## 2024-08-12 DIAGNOSIS — I10 Essential (primary) hypertension: Secondary | ICD-10-CM | POA: Diagnosis not present

## 2024-08-12 DIAGNOSIS — M810 Age-related osteoporosis without current pathological fracture: Secondary | ICD-10-CM | POA: Diagnosis not present

## 2024-08-12 DIAGNOSIS — K2100 Gastro-esophageal reflux disease with esophagitis, without bleeding: Secondary | ICD-10-CM

## 2024-08-12 DIAGNOSIS — E785 Hyperlipidemia, unspecified: Secondary | ICD-10-CM

## 2024-08-12 DIAGNOSIS — K52831 Collagenous colitis: Secondary | ICD-10-CM

## 2024-08-12 MED ORDER — BUDESONIDE 3 MG PO CPEP: 3.0000 mg | ORAL_CAPSULE | Freq: Three times a day (TID) | ORAL | 1 refills | Status: AC | PRN

## 2024-08-12 NOTE — Patient Instructions (Signed)
 Please call to schedule your mammogram at [phone]   Please check out this video for exercises to help reduce bone loss [email or web address]

## 2024-08-12 NOTE — Assessment & Plan Note (Addendum)
 Is supplement is on calcium 1200 mg daily and vitamin D3, encourage weight bearing exercise.

## 2024-08-12 NOTE — Assessment & Plan Note (Addendum)
 On  pravastatin  20 mg daily.  LDL 122 on 02/17/2024. May benefit from lower LDL <70 given hx of carotid arter stenosis s/p CEA.  Will repeat at next and discuss further with her after repeating lipids.

## 2024-08-12 NOTE — Progress Notes (Signed)
 "  Established Patient Office Visit  Subjective   Patient ID: Sharon Ayers, female    DOB: [birth year]  Age: 74 y.o. MRN: [id number]  Chief Complaint  Patient presents with   Hypertension    Sharon Ayers is a 74 y.o. person with medical hx listed below who presents today for transfer of care. Previously seeing Dr. Justus as PCP who recently retired. Please refer to problem based charting for further details and assessment and plan of current problem and chronic medical conditions.  Patient Active Problem List   Diagnosis Date Noted   Collagenous colitis 07/25/2017   Dyslipidemia 05/27/2015   Essential (primary) hypertension 05/27/2015   Generalized OA 05/27/2015   Esophagitis, reflux 05/27/2015   OP (osteoporosis) 05/27/2015   History of right breast cancer 03/12/2014   Abnormal Pap smear of cervix 03/26/2013   Carotid artery narrowing 11/27/2012      ROS Refer to HPI    Objective:     Outpatient Encounter Medications as of 08/12/2024  Medication Sig   Ascorbic Acid (VITAMIN C) 1000 MG tablet Take 1,000 mg by mouth daily.   aspirin 325 MG tablet Take 1 tablet by mouth daily.   calcium carbonate (OSCAL) 1500 (600 Ca) MG TABS tablet Take by mouth 2 (two) times daily with a meal.   ELDERBERRY PO Take by mouth in the morning and at bedtime.   hydrochlorothiazide  (MICROZIDE ) 12.5 MG capsule Take 1 capsule (12.5 mg total) by mouth daily.   MULTIPLE VITAMIN PO Take by mouth.   nebivolol  (BYSTOLIC ) 5 MG tablet TAKE 1 TABLET EVERY DAY   Omega-3 Fatty Acids (FISH OIL BURP-LESS) 1000 MG CAPS Take 1 capsule by mouth daily.   Omeprazole -Sodium Bicarbonate  (ZEGERID  OTC) 20-1100 MG CAPS capsule Take 1 capsule by mouth daily.   pravastatin  (PRAVACHOL ) 20 MG tablet TAKE 1 TABLET EVERY DAY   valsartan  (DIOVAN ) 320 MG tablet TAKE 1 TABLET EVERY DAY   [DISCONTINUED] budesonide  (ENTOCORT EC ) 3 MG 24 hr capsule Take 1 capsule (3 mg total) by mouth 3 (three) times daily as needed  (Collagenous Colitis).   budesonide  (ENTOCORT EC ) 3 MG 24 hr capsule Take 1 capsule (3 mg total) by mouth 3 (three) times daily as needed (Collagenous Colitis).   No facility-administered encounter medications on file as of 08/12/2024.    BP 124/76   Pulse 81   Ht 5' 3 (1.6 m)   Wt 133 lb (60.3 kg)   SpO2 97%   BMI 23.56 kg/m  BP Readings from Last 3 Encounters:  08/12/24 124/76  06/18/24 122/70  02/16/24 122/62    Physical Exam Constitutional:      Appearance: Normal appearance.  HENT:     Head: Normocephalic and atraumatic.     Mouth/Throat:     Mouth: Mucous membranes are moist.     Pharynx: Oropharynx is clear.  Cardiovascular:     Rate and Rhythm: Normal rate and regular rhythm.  Pulmonary:     Effort: Pulmonary effort is normal.     Breath sounds: No rhonchi or rales.  Abdominal:     General: Abdomen is flat. Bowel sounds are normal. There is no distension.     Palpations: Abdomen is soft.     Tenderness: There is no abdominal tenderness.  Musculoskeletal:        General: Normal range of motion.     Right lower leg: No edema.     Left lower leg: No edema.  Skin:    General: Skin  is warm and dry.     Capillary Refill: Capillary refill takes less than 2 seconds.  Neurological:     General: No focal deficit present.     Mental Status: She is alert and oriented to person, place, and time.  Psychiatric:        Mood and Affect: Mood normal.        Behavior: Behavior normal.        08/12/2024    9:28 AM 02/16/2024    7:54 AM 11/22/2023    8:57 AM  Depression screen PHQ 2/9  Decreased Interest 0 0 0  Down, Depressed, Hopeless 0 0 0  PHQ - 2 Score 0 0 0  Altered sleeping  0 0  Tired, decreased energy  0 0  Change in appetite  0 0  Feeling bad or failure about yourself   0 0  Trouble concentrating  0 0  Moving slowly or fidgety/restless  0 0  Suicidal thoughts  0 0  PHQ-9 Score  0  0   Difficult doing work/chores  Not difficult at all Not difficult at all      Data saved with a previous flowsheet row definition       08/12/2024    9:28 AM 02/16/2024    7:54 AM 09/25/2023    9:36 AM 07/27/2023    9:36 AM  GAD 7 : Generalized Anxiety Score  Nervous, Anxious, on Edge 0 0 0 0  Control/stop worrying 0 0 0 0  Worry too much - different things  0 0 0  Trouble relaxing  0 0 0  Restless  0 0 0  Easily annoyed or irritable  0 0 0  Afraid - awful might happen  0 0 0  Total GAD 7 Score  0 0 0  Anxiety Difficulty  Not difficult at all Not difficult at all Not difficult at all    No results found for any visits on 08/12/24.  Last CBC Lab Results  Component Value Date   WBC 10.1 02/16/2024   HGB 14.3 02/16/2024   HCT 43.1 02/16/2024   MCV 97 02/16/2024   MCH 32.1 02/16/2024   RDW 12.0 02/16/2024   PLT 305 02/16/2024   Last metabolic panel Lab Results  Component Value Date   GLUCOSE 85 02/16/2024   NA 139 02/16/2024   K 4.0 02/16/2024   CL 98 02/16/2024   CO2 21 02/16/2024   BUN 23 02/16/2024   CREATININE 0.84 02/16/2024   EGFR 75 02/16/2024   CALCIUM 9.9 02/16/2024   PROT 7.4 02/16/2024   ALBUMIN 4.9 02/16/2024   LABGLOB 2.5 02/16/2024   AGRATIO 2.4 (H) 02/09/2022   BILITOT 0.6 02/16/2024   ALKPHOS 98 02/16/2024   AST 23 02/16/2024   ALT 21 02/16/2024   Last lipids Lab Results  Component Value Date   CHOL 272 (H) 02/16/2024   HDL 139 02/16/2024   LDLCALC 122 (H) 02/16/2024   TRIG 72 02/16/2024   CHOLHDL 2.0 02/16/2024   Last hemoglobin A1c No results found for: HGBA1C Last thyroid functions Lab Results  Component Value Date   TSH 1.080 02/16/2024      The ASCVD Risk score (Arnett DK, et al., 2019) failed to calculate for the following reasons:   The valid HDL cholesterol range is 20 to 100 mg/dL    Assessment & Plan:  Essential (primary) hypertension Assessment & Plan: Currently on valsartan  320 mg daily, hydrochlorothiazide  12.5 mg daily, and nebivolol  5 mg daily. Well  controlled stable labs in July.     Collagenous colitis Assessment & Plan: Dx on colon biopsy in 2019, managing with diet and budesonide  as needed. Taking 1-2 tablets of budesonide  3mg  daily as needed. Continue as needed   Orders: -     Budesonide ; Take 1 capsule (3 mg total) by mouth 3 (three) times daily as needed (Collagenous Colitis).  Dispense: 270 capsule; Refill: 1  Dyslipidemia Assessment & Plan: On  pravastatin  20 mg daily.  LDL 122 on 02/17/2024. May benefit from lower LDL <70 given hx of carotid arter stenosis s/p CEA.  Will repeat at next and discuss further with her after repeating lipids.     Gastroesophageal reflux disease with esophagitis without hemorrhage Assessment & Plan: Controlled on zegred, denies melena, weight loss, or dysphagia    Age-related osteoporosis without current pathological fracture Assessment & Plan: Is supplement is on calcium 1200 mg daily and vitamin D3, encourage weight bearing exercise.       Return in about 6 months (around 02/09/2025) for physical.    Harlene Saddler, MD "

## 2024-08-12 NOTE — Assessment & Plan Note (Addendum)
 Dx on colon biopsy in 2019, managing with diet and budesonide  as needed. Taking 1-2 tablets of budesonide  3mg  daily as needed. Continue as needed

## 2024-08-12 NOTE — Assessment & Plan Note (Signed)
 Currently on valsartan  320 mg daily, hydrochlorothiazide  12.5 mg daily, and nebivolol  5 mg daily. Well controlled stable labs in July.

## 2024-08-12 NOTE — Assessment & Plan Note (Signed)
 Controlled on zegred, denies melena, weight loss, or dysphagia

## 2024-08-23 ENCOUNTER — Ambulatory Visit: Admitting: Student

## 2024-11-27 ENCOUNTER — Ambulatory Visit

## 2025-02-17 ENCOUNTER — Encounter: Admitting: Student

## 2025-02-18 ENCOUNTER — Encounter: Admitting: Student
